# Patient Record
Sex: Male | Born: 1955 | Race: White | Hispanic: No | Marital: Married | Smoking: Former smoker
Health system: Southern US, Community
[De-identification: ages and names within clinical notes are randomized; demographics above are authoritative.]

## PROBLEM LIST (undated history)

## (undated) DIAGNOSIS — R0600 Dyspnea, unspecified: Secondary | ICD-10-CM

## (undated) DIAGNOSIS — J449 Chronic obstructive pulmonary disease, unspecified: Secondary | ICD-10-CM

## (undated) DIAGNOSIS — F329 Major depressive disorder, single episode, unspecified: Secondary | ICD-10-CM

## (undated) DIAGNOSIS — F419 Anxiety disorder, unspecified: Secondary | ICD-10-CM

## (undated) DIAGNOSIS — N189 Chronic kidney disease, unspecified: Secondary | ICD-10-CM

## (undated) DIAGNOSIS — F32A Depression, unspecified: Secondary | ICD-10-CM

## (undated) DIAGNOSIS — I499 Cardiac arrhythmia, unspecified: Secondary | ICD-10-CM

## (undated) DIAGNOSIS — J189 Pneumonia, unspecified organism: Secondary | ICD-10-CM

## (undated) DIAGNOSIS — M1990 Unspecified osteoarthritis, unspecified site: Secondary | ICD-10-CM

## (undated) DIAGNOSIS — K219 Gastro-esophageal reflux disease without esophagitis: Secondary | ICD-10-CM

## (undated) DIAGNOSIS — J45909 Unspecified asthma, uncomplicated: Secondary | ICD-10-CM

## (undated) HISTORY — PX: ABLATION: SHX5711

## (undated) HISTORY — PX: LUNG BIOPSY: SHX232

---

## 2017-06-12 DIAGNOSIS — I4891 Unspecified atrial fibrillation: Secondary | ICD-10-CM | POA: Diagnosis not present

## 2017-06-12 DIAGNOSIS — I10 Essential (primary) hypertension: Secondary | ICD-10-CM | POA: Diagnosis not present

## 2017-06-12 DIAGNOSIS — J9621 Acute and chronic respiratory failure with hypoxia: Secondary | ICD-10-CM | POA: Diagnosis not present

## 2017-06-12 DIAGNOSIS — J449 Chronic obstructive pulmonary disease, unspecified: Secondary | ICD-10-CM

## 2017-06-12 DIAGNOSIS — J189 Pneumonia, unspecified organism: Secondary | ICD-10-CM | POA: Diagnosis not present

## 2017-06-13 DIAGNOSIS — J449 Chronic obstructive pulmonary disease, unspecified: Secondary | ICD-10-CM | POA: Diagnosis not present

## 2017-06-13 DIAGNOSIS — J9621 Acute and chronic respiratory failure with hypoxia: Secondary | ICD-10-CM | POA: Diagnosis not present

## 2017-06-13 DIAGNOSIS — I10 Essential (primary) hypertension: Secondary | ICD-10-CM | POA: Diagnosis not present

## 2017-06-13 DIAGNOSIS — J189 Pneumonia, unspecified organism: Secondary | ICD-10-CM | POA: Diagnosis not present

## 2017-06-13 DIAGNOSIS — I4891 Unspecified atrial fibrillation: Secondary | ICD-10-CM | POA: Diagnosis not present

## 2017-06-14 DIAGNOSIS — J449 Chronic obstructive pulmonary disease, unspecified: Secondary | ICD-10-CM | POA: Diagnosis not present

## 2017-06-14 DIAGNOSIS — J9621 Acute and chronic respiratory failure with hypoxia: Secondary | ICD-10-CM | POA: Diagnosis not present

## 2017-06-14 DIAGNOSIS — J189 Pneumonia, unspecified organism: Secondary | ICD-10-CM | POA: Diagnosis not present

## 2017-06-14 DIAGNOSIS — I4891 Unspecified atrial fibrillation: Secondary | ICD-10-CM | POA: Diagnosis not present

## 2017-06-15 DIAGNOSIS — I4891 Unspecified atrial fibrillation: Secondary | ICD-10-CM | POA: Diagnosis not present

## 2017-06-15 DIAGNOSIS — J449 Chronic obstructive pulmonary disease, unspecified: Secondary | ICD-10-CM | POA: Diagnosis not present

## 2017-06-15 DIAGNOSIS — J9621 Acute and chronic respiratory failure with hypoxia: Secondary | ICD-10-CM | POA: Diagnosis not present

## 2017-06-15 DIAGNOSIS — J189 Pneumonia, unspecified organism: Secondary | ICD-10-CM | POA: Diagnosis not present

## 2017-06-16 DIAGNOSIS — J9621 Acute and chronic respiratory failure with hypoxia: Secondary | ICD-10-CM | POA: Diagnosis not present

## 2017-06-16 DIAGNOSIS — I4891 Unspecified atrial fibrillation: Secondary | ICD-10-CM | POA: Diagnosis not present

## 2017-06-16 DIAGNOSIS — J449 Chronic obstructive pulmonary disease, unspecified: Secondary | ICD-10-CM | POA: Diagnosis not present

## 2017-06-16 DIAGNOSIS — J189 Pneumonia, unspecified organism: Secondary | ICD-10-CM | POA: Diagnosis not present

## 2017-07-16 ENCOUNTER — Other Ambulatory Visit: Payer: Self-pay

## 2017-07-16 ENCOUNTER — Inpatient Hospital Stay (HOSPITAL_COMMUNITY)
Admission: EM | Admit: 2017-07-16 | Discharge: 2017-07-19 | DRG: 190 | Disposition: A | Discharge status: Home Health | Payer: Medicaid Other | Attending: Family Medicine | Admitting: Family Medicine

## 2017-07-16 ENCOUNTER — Emergency Department (HOSPITAL_COMMUNITY): Payer: Medicaid Other

## 2017-07-16 ENCOUNTER — Encounter (HOSPITAL_COMMUNITY): Payer: Self-pay | Admitting: Nurse Practitioner

## 2017-07-16 DIAGNOSIS — K219 Gastro-esophageal reflux disease without esophagitis: Secondary | ICD-10-CM | POA: Diagnosis present

## 2017-07-16 DIAGNOSIS — I129 Hypertensive chronic kidney disease with stage 1 through stage 4 chronic kidney disease, or unspecified chronic kidney disease: Secondary | ICD-10-CM | POA: Diagnosis present

## 2017-07-16 DIAGNOSIS — R531 Weakness: Secondary | ICD-10-CM | POA: Diagnosis not present

## 2017-07-16 DIAGNOSIS — G8929 Other chronic pain: Secondary | ICD-10-CM | POA: Diagnosis present

## 2017-07-16 DIAGNOSIS — Z87891 Personal history of nicotine dependence: Secondary | ICD-10-CM

## 2017-07-16 DIAGNOSIS — J206 Acute bronchitis due to rhinovirus: Secondary | ICD-10-CM | POA: Diagnosis present

## 2017-07-16 DIAGNOSIS — E876 Hypokalemia: Secondary | ICD-10-CM | POA: Diagnosis present

## 2017-07-16 DIAGNOSIS — Z9981 Dependence on supplemental oxygen: Secondary | ICD-10-CM | POA: Diagnosis not present

## 2017-07-16 DIAGNOSIS — J441 Chronic obstructive pulmonary disease with (acute) exacerbation: Secondary | ICD-10-CM

## 2017-07-16 DIAGNOSIS — J9620 Acute and chronic respiratory failure, unspecified whether with hypoxia or hypercapnia: Secondary | ICD-10-CM | POA: Diagnosis present

## 2017-07-16 DIAGNOSIS — D72829 Elevated white blood cell count, unspecified: Secondary | ICD-10-CM | POA: Diagnosis present

## 2017-07-16 DIAGNOSIS — Q600 Renal agenesis, unilateral: Secondary | ICD-10-CM

## 2017-07-16 DIAGNOSIS — I495 Sick sinus syndrome: Secondary | ICD-10-CM | POA: Diagnosis present

## 2017-07-16 DIAGNOSIS — Z881 Allergy status to other antibiotic agents status: Secondary | ICD-10-CM

## 2017-07-16 DIAGNOSIS — N189 Chronic kidney disease, unspecified: Secondary | ICD-10-CM | POA: Diagnosis present

## 2017-07-16 DIAGNOSIS — G4700 Insomnia, unspecified: Secondary | ICD-10-CM | POA: Diagnosis present

## 2017-07-16 DIAGNOSIS — T380X5A Adverse effect of glucocorticoids and synthetic analogues, initial encounter: Secondary | ICD-10-CM | POA: Diagnosis present

## 2017-07-16 DIAGNOSIS — Z888 Allergy status to other drugs, medicaments and biological substances status: Secondary | ICD-10-CM

## 2017-07-16 DIAGNOSIS — Z7951 Long term (current) use of inhaled steroids: Secondary | ICD-10-CM | POA: Diagnosis not present

## 2017-07-16 DIAGNOSIS — R51 Headache: Secondary | ICD-10-CM | POA: Diagnosis present

## 2017-07-16 DIAGNOSIS — Z7901 Long term (current) use of anticoagulants: Secondary | ICD-10-CM

## 2017-07-16 DIAGNOSIS — J431 Panlobular emphysema: Principal | ICD-10-CM | POA: Diagnosis present

## 2017-07-16 DIAGNOSIS — J9621 Acute and chronic respiratory failure with hypoxia: Secondary | ICD-10-CM

## 2017-07-16 DIAGNOSIS — I480 Paroxysmal atrial fibrillation: Secondary | ICD-10-CM | POA: Diagnosis present

## 2017-07-16 DIAGNOSIS — Z91013 Allergy to seafood: Secondary | ICD-10-CM

## 2017-07-16 HISTORY — DX: Dyspnea, unspecified: R06.00

## 2017-07-16 HISTORY — DX: Anxiety disorder, unspecified: F41.9

## 2017-07-16 HISTORY — DX: Cardiac arrhythmia, unspecified: I49.9

## 2017-07-16 HISTORY — DX: Chronic kidney disease, unspecified: N18.9

## 2017-07-16 HISTORY — DX: Unspecified asthma, uncomplicated: J45.909

## 2017-07-16 HISTORY — DX: Chronic obstructive pulmonary disease, unspecified: J44.9

## 2017-07-16 HISTORY — DX: Unspecified osteoarthritis, unspecified site: M19.90

## 2017-07-16 HISTORY — DX: Pneumonia, unspecified organism: J18.9

## 2017-07-16 HISTORY — DX: Depression, unspecified: F32.A

## 2017-07-16 HISTORY — DX: Gastro-esophageal reflux disease without esophagitis: K21.9

## 2017-07-16 HISTORY — DX: Major depressive disorder, single episode, unspecified: F32.9

## 2017-07-16 LAB — CBC
HCT: 37.5 % — ABNORMAL LOW (ref 39.0–52.0)
Hemoglobin: 13.1 g/dL (ref 13.0–17.0)
MCH: 30.1 pg (ref 26.0–34.0)
MCHC: 34.9 g/dL (ref 30.0–36.0)
MCV: 86.2 fL (ref 78.0–100.0)
PLATELETS: 142 10*3/uL — AB (ref 150–400)
RBC: 4.35 MIL/uL (ref 4.22–5.81)
RDW: 15.2 % (ref 11.5–15.5)
WBC: 13.3 10*3/uL — AB (ref 4.0–10.5)

## 2017-07-16 LAB — RESPIRATORY PANEL BY PCR
ADENOVIRUS-RVPPCR: NOT DETECTED
BORDETELLA PERTUSSIS-RVPCR: NOT DETECTED
CHLAMYDOPHILA PNEUMONIAE-RVPPCR: NOT DETECTED
CORONAVIRUS HKU1-RVPPCR: NOT DETECTED
CORONAVIRUS NL63-RVPPCR: NOT DETECTED
Coronavirus 229E: NOT DETECTED
Coronavirus OC43: NOT DETECTED
Influenza A: NOT DETECTED
Influenza B: NOT DETECTED
MYCOPLASMA PNEUMONIAE-RVPPCR: NOT DETECTED
Metapneumovirus: NOT DETECTED
Parainfluenza Virus 1: NOT DETECTED
Parainfluenza Virus 2: NOT DETECTED
Parainfluenza Virus 3: NOT DETECTED
Parainfluenza Virus 4: NOT DETECTED
Respiratory Syncytial Virus: NOT DETECTED
Rhinovirus / Enterovirus: DETECTED — AB

## 2017-07-16 LAB — COMPREHENSIVE METABOLIC PANEL
ALK PHOS: 96 U/L (ref 38–126)
ALT: 33 U/L (ref 17–63)
ANION GAP: 11 (ref 5–15)
AST: 27 U/L (ref 15–41)
Albumin: 3.3 g/dL — ABNORMAL LOW (ref 3.5–5.0)
BILIRUBIN TOTAL: 1.4 mg/dL — AB (ref 0.3–1.2)
BUN: 35 mg/dL — ABNORMAL HIGH (ref 6–20)
CALCIUM: 8.7 mg/dL — AB (ref 8.9–10.3)
CO2: 32 mmol/L (ref 22–32)
Chloride: 91 mmol/L — ABNORMAL LOW (ref 101–111)
Creatinine, Ser: 0.96 mg/dL (ref 0.61–1.24)
GFR calc Af Amer: >60 mL/min (ref >60)
Glucose, Bld: 135 mg/dL — ABNORMAL HIGH (ref 65–99)
Potassium: 2.7 mmol/L — CL (ref 3.5–5.1)
SODIUM: 134 mmol/L — AB (ref 135–145)
TOTAL PROTEIN: 5.6 g/dL — AB (ref 6.5–8.1)

## 2017-07-16 LAB — MAGNESIUM: MAGNESIUM: 2 mg/dL (ref 1.7–2.4)

## 2017-07-16 LAB — I-STAT TROPONIN, ED: TROPONIN I, POC: 0.01 ng/mL (ref 0.00–0.08)

## 2017-07-16 MED ORDER — ACETAMINOPHEN 650 MG RE SUPP: 650.0000 mg | Freq: Four times a day (QID) | RECTAL | Status: DC | PRN

## 2017-07-16 MED ORDER — ACETAMINOPHEN 325 MG PO TABS
650.0000 mg | ORAL_TABLET | Freq: Once | ORAL | Status: AC
  Administered 2017-07-16: 650 mg via ORAL
  Filled 2017-07-16: qty 2

## 2017-07-16 MED ORDER — APIXABAN 5 MG PO TABS
5.0000 mg | ORAL_TABLET | Freq: Two times a day (BID) | ORAL | Status: DC
  Administered 2017-07-16 – 2017-07-19 (×6): 5 mg via ORAL
  Filled 2017-07-16 (×6): qty 1

## 2017-07-16 MED ORDER — SODIUM CHLORIDE 0.9 % IV SOLN
INTRAVENOUS | Status: DC
  Administered 2017-07-16: 16:00:00 via INTRAVENOUS

## 2017-07-16 MED ORDER — ZOLPIDEM TARTRATE 10 MG PO TABS
10.0000 mg | ORAL_TABLET | Freq: Every evening | ORAL | Status: DC | PRN
  Administered 2017-07-16 – 2017-07-18 (×3): 10 mg via ORAL
  Filled 2017-07-16 (×3): qty 1

## 2017-07-16 MED ORDER — ACETAMINOPHEN 325 MG PO TABS: 650.0000 mg | ORAL_TABLET | Freq: Four times a day (QID) | ORAL | Status: DC | PRN

## 2017-07-16 MED ORDER — PREDNISONE 20 MG PO TABS
40.0000 mg | ORAL_TABLET | Freq: Every day | ORAL | Status: DC
  Administered 2017-07-18 – 2017-07-19 (×2): 40 mg via ORAL
  Filled 2017-07-16 (×2): qty 2

## 2017-07-16 MED ORDER — HYDROMORPHONE HCL 1 MG/ML IJ SOLN
0.5000 mg | Freq: Once | INTRAMUSCULAR | Status: AC
  Administered 2017-07-16: 0.5 mg via INTRAVENOUS
  Filled 2017-07-16: qty 1

## 2017-07-16 MED ORDER — ALBUTEROL SULFATE (2.5 MG/3ML) 0.083% IN NEBU
5.0000 mg | INHALATION_SOLUTION | Freq: Once | RESPIRATORY_TRACT | Status: AC
  Administered 2017-07-16: 5 mg via RESPIRATORY_TRACT
  Filled 2017-07-16: qty 6

## 2017-07-16 MED ORDER — METHYLPREDNISOLONE SODIUM SUCC 125 MG IJ SOLR
80.0000 mg | Freq: Three times a day (TID) | INTRAMUSCULAR | Status: AC
  Administered 2017-07-16 – 2017-07-18 (×5): 80 mg via INTRAVENOUS
  Filled 2017-07-16 (×5): qty 2

## 2017-07-16 MED ORDER — HYDROCODONE-ACETAMINOPHEN 5-325 MG PO TABS
1.0000 | ORAL_TABLET | Freq: Three times a day (TID) | ORAL | Status: DC | PRN
  Administered 2017-07-16 – 2017-07-17 (×2): 1 via ORAL
  Filled 2017-07-16 (×2): qty 1

## 2017-07-16 MED ORDER — IPRATROPIUM-ALBUTEROL 0.5-2.5 (3) MG/3ML IN SOLN
3.0000 mL | Freq: Four times a day (QID) | RESPIRATORY_TRACT | Status: DC
  Administered 2017-07-16 – 2017-07-19 (×10): 3 mL via RESPIRATORY_TRACT
  Filled 2017-07-16 (×10): qty 3

## 2017-07-16 MED ORDER — IPRATROPIUM BROMIDE 0.02 % IN SOLN
0.5000 mg | Freq: Once | RESPIRATORY_TRACT | Status: AC
  Administered 2017-07-16: 0.5 mg via RESPIRATORY_TRACT
  Filled 2017-07-16: qty 2.5

## 2017-07-16 MED ORDER — PANTOPRAZOLE SODIUM 40 MG PO TBEC
40.0000 mg | DELAYED_RELEASE_TABLET | Freq: Every day | ORAL | Status: DC
  Administered 2017-07-17 – 2017-07-19 (×3): 40 mg via ORAL
  Filled 2017-07-16 (×3): qty 1

## 2017-07-16 MED ORDER — POTASSIUM CHLORIDE CRYS ER 20 MEQ PO TBCR
40.0000 meq | EXTENDED_RELEASE_TABLET | Freq: Once | ORAL | Status: AC
Start: 2017-07-16 — End: 2017-07-16
  Administered 2017-07-16: 40 meq via ORAL
  Filled 2017-07-16: qty 2

## 2017-07-16 MED ORDER — POTASSIUM CHLORIDE 10 MEQ/100ML IV SOLN
10.0000 meq | Freq: Once | INTRAVENOUS | Status: AC
  Administered 2017-07-16: 10 meq via INTRAVENOUS
  Filled 2017-07-16: qty 100

## 2017-07-16 MED ORDER — METHYLPREDNISOLONE SODIUM SUCC 125 MG IJ SOLR
125.0000 mg | Freq: Once | INTRAMUSCULAR | Status: AC
  Administered 2017-07-16: 125 mg via INTRAVENOUS
  Filled 2017-07-16: qty 2

## 2017-07-16 MED ORDER — FAMOTIDINE 20 MG PO TABS
20.0000 mg | ORAL_TABLET | Freq: Every day | ORAL | Status: DC
  Administered 2017-07-16 – 2017-07-18 (×3): 20 mg via ORAL
  Filled 2017-07-16 (×3): qty 1

## 2017-07-16 MED ORDER — SODIUM CHLORIDE 0.9% FLUSH
3.0000 mL | Freq: Two times a day (BID) | INTRAVENOUS | Status: DC
  Administered 2017-07-16 – 2017-07-19 (×6): 3 mL via INTRAVENOUS

## 2017-07-16 MED ORDER — MOMETASONE FURO-FORMOTEROL FUM 200-5 MCG/ACT IN AERO
2.0000 | INHALATION_SPRAY | Freq: Two times a day (BID) | RESPIRATORY_TRACT | Status: DC
  Administered 2017-07-16 – 2017-07-19 (×6): 2 via RESPIRATORY_TRACT
  Filled 2017-07-16: qty 8.8

## 2017-07-16 MED ORDER — DILTIAZEM HCL ER BEADS 240 MG PO CP24
240.0000 mg | ORAL_CAPSULE | Freq: Every day | ORAL | Status: DC
  Administered 2017-07-17 – 2017-07-19 (×3): 240 mg via ORAL
  Filled 2017-07-16 (×6): qty 1

## 2017-07-16 MED ORDER — CLONIDINE HCL 0.1 MG PO TABS
0.1000 mg | ORAL_TABLET | Freq: Two times a day (BID) | ORAL | Status: DC
  Administered 2017-07-16 – 2017-07-19 (×6): 0.1 mg via ORAL
  Filled 2017-07-16 (×6): qty 1

## 2017-07-16 MED ORDER — TRAMADOL HCL 50 MG PO TABS
50.0000 mg | ORAL_TABLET | Freq: Three times a day (TID) | ORAL | Status: DC
  Administered 2017-07-16 – 2017-07-19 (×8): 50 mg via ORAL
  Filled 2017-07-16 (×8): qty 1

## 2017-07-16 MED ORDER — FLUTICASONE PROPIONATE 50 MCG/ACT NA SUSP
1.0000 | Freq: Two times a day (BID) | NASAL | Status: DC
  Administered 2017-07-16 – 2017-07-19 (×5): 1 via NASAL
  Filled 2017-07-16: qty 16

## 2017-07-16 MED ORDER — ONDANSETRON HCL 4 MG PO TABS: 4.0000 mg | ORAL_TABLET | Freq: Four times a day (QID) | ORAL | Status: DC | PRN

## 2017-07-16 MED ORDER — NITROGLYCERIN 0.4 MG SL SUBL: 0.4000 mg | SUBLINGUAL_TABLET | SUBLINGUAL | Status: DC | PRN

## 2017-07-16 MED ORDER — IOPAMIDOL (ISOVUE-300) INJECTION 61%
INTRAVENOUS | Status: AC
  Administered 2017-07-16: 75 mL via INTRAVENOUS
  Filled 2017-07-16: qty 75

## 2017-07-16 MED ORDER — HYDROCHLOROTHIAZIDE 25 MG PO TABS
25.0000 mg | ORAL_TABLET | Freq: Every day | ORAL | Status: DC
  Administered 2017-07-16 – 2017-07-19 (×4): 25 mg via ORAL
  Filled 2017-07-16 (×4): qty 1

## 2017-07-16 MED ORDER — PROPAFENONE HCL 150 MG PO TABS
150.0000 mg | ORAL_TABLET | Freq: Three times a day (TID) | ORAL | Status: DC
  Administered 2017-07-16 – 2017-07-19 (×9): 150 mg via ORAL
  Filled 2017-07-16 (×9): qty 1

## 2017-07-16 MED ORDER — ALBUTEROL SULFATE (2.5 MG/3ML) 0.083% IN NEBU
2.5000 mg | INHALATION_SOLUTION | RESPIRATORY_TRACT | Status: DC | PRN
  Administered 2017-07-17 – 2017-07-18 (×2): 2.5 mg via RESPIRATORY_TRACT
  Filled 2017-07-16 (×2): qty 3

## 2017-07-16 MED ORDER — ONDANSETRON HCL 4 MG/2ML IJ SOLN
4.0000 mg | Freq: Four times a day (QID) | INTRAMUSCULAR | Status: DC | PRN
  Administered 2017-07-17 – 2017-07-18 (×2): 4 mg via INTRAVENOUS
  Filled 2017-07-16 (×2): qty 2

## 2017-07-16 NOTE — H&P (Addendum)
History and Physical   Stephen Vazquez WUJ:[id number] DOB: 03/04/1956 DOA: 07/16/2017  Referring MD/NP/PA: Denton Lank PCP: Simone Curia, MD Outpatient Specialists: [facility] cardiology, Dr. Rhona Leavens  Patient coming from: Home  Chief Complaint: Weakness, dyspnea  HPI: Stephen Vazquez is a 62 y.o. male with a history of 2L-dependent COPD, PAF on eliquis who presented to the ED with one month of progressive weakness during which time he noted swelling in his face/head. He presented to multiple providers during this time for cough and dyspnea, receiving multiple antibiotics and steroid tapers with temporary improvement in respiratory symptoms, though over the past few days he's had severe, constant dyspnea with any exertion, nonproductive cough. He denies sick contacts, got his flu shot, stopped smoking 14 years ago. He's had COPD for >5 years, compliant with medications, does not have a pulmonologist.   ED Course: 3 rounds of breathing treatments provided with very limited improvement in dyspnea. CTA negative for acute process, but demonstrated severe emphysema and peribronchial thickening. He reports poor per oral intake and potassium was low, repleted in ED.   Review of Systems: +extremity weakness and bruising more easily on arms. Denies fevers, chest pain, palpitations, abd pain, N/V/D, dysuria, change in bowel habits, and per HPI. All others reviewed and are negative.   Past Medical History:  Diagnosis Date  . Anxiety   . Arthritis   . Asthma   . 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 COPD (chronic obstructive pulmonary disease) (HCC)   . Depression   . Dyspnea   . Dysrhythmia   . GERD (gastroesophageal reflux disease)   . Pneumonia    Past Surgical History:  Procedure Laterality Date  . ABLATION    . LUNG BIOPSY     x 2   - No longer smoking, no EtOH, cared for by his brother who is at the bedside.  reports that he quit smoking about 14 years ago. His smoking use included  cigarettes. He quit smokeless tobacco use about 11 years ago. His smokeless tobacco use included chew. He reports that he does not drink alcohol or use drugs. Allergies  Allergen Reactions  . Shellfish Allergy Nausea And Vomiting  . Ketorolac Palpitations  . Levaquin [Levofloxacin] Palpitations   Family History  Problem Relation Age of Onset  . COPD Brother    - Brother also has COPD. Family history otherwise reviewed and not pertinent. Prior to Admission medications   Medication Sig Start Date End Date Taking? Authorizing Provider  albuterol (VENTOLIN HFA) 108 (90 Base) MCG/ACT inhaler Inhale 1 puff into the lungs every 4 (four) hours as needed for wheezing or shortness of breath.    Yes [provider]  apixaban (ELIQUIS) 5 MG TABS tablet Take 5 mg by mouth 2 (two) times daily. 03/13/17  Yes [provider]  cloNIDine (CATAPRES) 0.1 MG tablet Take 0.1 mg by mouth 2 (two) times daily. 04/10/17 04/10/18 Yes [provider]  diltiazem (TIAZAC) 240 MG 24 hr capsule Take 240 mg by mouth daily.   Yes [provider]  fluticasone (FLONASE) 50 MCG/ACT nasal spray Place 1 spray into the nose 2 (two) times daily.   Yes [provider]  fluticasone-salmeterol (ADVAIR HFA) 230-21 MCG/ACT inhaler Inhale 2 puffs into the lungs 2 (two) times daily. 03/20/17  Yes [provider]  furosemide (LASIX) 20 MG tablet Take 20 mg by mouth daily.   Yes [provider]  hydrochlorothiazide (HYDRODIURIL) 25 MG tablet Take 25  mg by mouth daily. 03/26/17  Yes [provider]  HYDROcodone-acetaminophen (NORCO/VICODIN) 5-325 MG tablet Take 1 tablet by mouth every 8 (eight) hours as needed for moderate pain.  05/19/17  Yes [provider]  nitroGLYCERIN (NITROSTAT) 0.4 MG SL tablet Place 0.4 mg under the tongue every 5 (five) minutes as needed for chest pain.    Yes [provider]  omeprazole (PRILOSEC) 20 MG capsule Take 20 mg by mouth  daily.   Yes [provider]  propafenone (RYTHMOL) 150 MG tablet Take 150 mg by mouth every 8 (eight) hours. 04/08/17 04/08/18 Yes [provider]  ranitidine (ZANTAC) 300 MG tablet Take 300 mg by mouth at bedtime.   Yes [provider]  tiotropium (SPIRIVA) 18 MCG inhalation capsule Place 18 mcg into inhaler and inhale daily.    Yes [provider]  traMADol (ULTRAM) 50 MG tablet Take 50 mg by mouth 3 (three) times daily.   Yes [provider]  zolpidem (AMBIEN) 10 MG tablet Take 10 mg by mouth at bedtime as needed for sleep. 05/19/17  Yes [provider]    Physical Exam: Vitals:   07/16/17 1428 07/16/17 1500 07/16/17 1653 07/16/17 1701  BP:  (!) 154/103  (!) 154/100  Pulse:  90  91  Resp:  16  16  Temp:      TempSrc:      SpO2: 98% 99% 97% 100%  Weight:      Height:       Constitutional: Ill but non-toxic 62 y.o. male in no distress, calm demeanor Eyes: Lids and conjunctivae normal, PERRL ENMT: Mucous membranes are moist. Posterior pharynx clear of any exudate or lesions. Poor dentition.  Neck: normal, supple, no masses, no thyromegaly Respiratory: Nonlabored tachypnea with prolonged expiration on 2L by [place], fair air movement with diffuse expiratory wheezing/rhonchi Cardiovascular: Regular rate and rhythm, no murmurs, rubs, or gallops. No carotid bruits. No JVD. No LE edema. 2+ pedal pulses. Abdomen: Normoactive bowel sounds. No tenderness, non-distended, and no masses palpated. No hepatosplenomegaly. GU: No indwelling catheter Musculoskeletal: No cyanosis. No joint deformity upper and lower extremities. Cushingoid habitus. Skin: Warm, dry. Ecchymoses on bilateral forearms. Generalized facial fullness noted.  Neurologic: CN II-XII grossly intact. Gait not assessed. Speech normal. Diffusely weak, needing help to rise for lung exam, no focal deficits in motor strength or sensation in all extremities.  Psychiatric: Alert and oriented  x3. Intact judgment and insight. Mood euthymic with congruent affect.   Labs on Admission: I have personally reviewed following labs and imaging studies  CBC: Recent Labs  Lab 07/16/17 1404  WBC 13.3*  HGB 13.1  HCT 37.5*  MCV 86.2  PLT 142*   Basic Metabolic Panel: Recent Labs  Lab 07/16/17 1404  NA 134*  K 2.7*  CL 91*  CO2 32  GLUCOSE 135*  BUN 35*  CREATININE 0.96  CALCIUM 8.7*  MG 2.0   Liver Function Tests: Recent Labs  Lab 07/16/17 1404  AST 27  ALT 33  ALKPHOS 96  BILITOT 1.4*  PROT 5.6*  ALBUMIN 3.3*   Radiological Exams on Admission: Dg Chest 2 View  Result Date: 07/16/2017 CLINICAL DATA:  Worsening cough, congestion and dyspnea EXAM: CHEST  2 VIEW COMPARISON:  07/13/2017 FINDINGS: The heart size and mediastinal contours are within normal limits. No pneumonic consolidations or CHF. Chronic subpleural areas of interstitial prominence likely reflecting atelectasis and/or fibrosis are identified along the periphery of the right lung and left lung base. The  visualized skeletal structures are unremarkable. IMPRESSION: No active cardiopulmonary disease. Electronically Signed   By: Tollie Eth M.D.   On: 07/16/2017 13:31   Ct Head Wo Contrast  Result Date: 07/16/2017 CLINICAL DATA:  Headache over the last month.  Worsening weakness. EXAM: CT HEAD WITHOUT CONTRAST TECHNIQUE: Contiguous axial images were obtained from the base of the skull through the vertex without intravenous contrast. COMPARISON:  06/26/2016 FINDINGS: Brain: No evidence of malformation, atrophy, old or acute small or large vessel infarction, mass lesion, hemorrhage, hydrocephalus or extra-axial collection. No evidence of pituitary lesion. Vascular: No vascular calcification.  No hyperdense vessels. Skull: Normal.  No fracture or focal bone lesion. Sinuses/Orbits: Visualized sinuses are clear. No fluid in the middle ears or mastoids. Visualized orbits are normal. Other: None significant IMPRESSION:  Normal examination. No abnormality seen to explain the presenting symptoms. Electronically Signed   By: Paulina Fusi M.D.   On: 07/16/2017 16:15   Ct Chest W Contrast  Result Date: 07/16/2017 CLINICAL DATA:  Worsening shortness of breath with chest congestion and productive cough. History COPD. EXAM: CT CHEST WITH CONTRAST TECHNIQUE: Multidetector CT imaging of the chest was performed during intravenous contrast administration. CONTRAST:  75mL ISOVUE-300 IOPAMIDOL (ISOVUE-300) INJECTION 61% COMPARISON:  CT chest dated June 13, 2017. FINDINGS: Cardiovascular: Normal heart size. No pericardial effusion. Normal caliber thoracic aorta with scattered with atherosclerosis. Unchanged chronic dilatation of the main pulmonary artery. No central pulmonary embolism. Mediastinum/Nodes: Multiple previously seen prominent mediastinal lymph nodes have decreased in size, now subcentimeter. No mediastinal, hilar, or axillary lymphadenopathy. The thyroid gland, trachea, and esophagus demonstrate no significant abnormalities. Lungs/Pleura: Severe emphysematous changes again noted. Mild central peribronchial thickening. Previously seen ground-glass opacities in both lower lobes the right upper lobe have resolved. Unchanged subpleural right middle lobe nodule measuring 5 mm (series 6, image 99). Unchanged subpleural left lower lobe nodule measuring 6 mm (series 6, image 117). No pleural effusion or pneumothorax. Upper Abdomen: No acute abnormality. Unchanged severely atrophic left kidney. Musculoskeletal: No chest wall abnormality. No acute or significant osseous findings. IMPRESSION: 1. No acute intrathoracic process. Resolved ground-glass opacities in both lower lobes and the right upper lobe. 2. Severe emphysema (ICD10-J43.9). Persistent central peribronchial thickening which may be smoking-related or secondary to acute bronchitis. 3. Interval decrease in size of previously seen prominent mediastinal lymph nodes, now  subcentimeter. Electronically Signed   By: Obie Dredge M.D.   On: 07/16/2017 16:25   Assessment/Plan Principal Problem:   COPD with acute exacerbation (HCC) Active Problems:   Acute on chronic respiratory failure (HCC)   Panlobular emphysema (HCC)   GERD (gastroesophageal reflux disease)   AF (paroxysmal atrial fibrillation) (HCC)   Hypokalemia   Weakness   Insomnia   Acute on chronic hypoxic respiratory failure: Due to acute bronchitis/COPD exacerbation. Severe panlobular emphysema noted on CT. Stopped smoking 14 years ago.  - IV steroids followed by prednisone as ordered, modify as needed. - Continue advair (dulera sub) - Scheduled duoneb, prn albuterol - Check RVP, empiric droplet precautions.  - CM consulted - Really needs pulmonology as outpatient.   Weakness:  - PT/OT consulted  Facial fullness/headache: Without airway compromise or focal swelling, suspect this could be related to cushingoid habitus. CT head normal. - Monitor   Hypokalemia: Replaced in ED.  - Recheck in AM, checking Mg now  HTN: Chronic, currently elevated.  - Restart home medications: clonidine, HCTZ  PAF and SSS: Followed by Dr. Rhona Leavens, [facility] Cardiology. Currently appears to be NSR on  monitor.  - Check ECG, troponin wnl.  - Continue eliquis, diltiazem, propafenone. No longer on BB.   GERD: Chronic, stable - Home medications  Insomnia:  - Home med ordered  Chronic pain:  - Continue home medications.   DVT prophylaxis: Eliquis  Code Status: Full, confirmed with patient and family at admission  Family Communication: Brother, SIL at bedside Disposition Plan: Uncertain Consults called: None  Admission status: Inpatient    Hazeline Junker, MD [facility] Pager [phone]  If 7PM-7AM, please contact night-coverage [email or web address] Password [facility] 07/16/2017, 5:59 PM

## 2017-07-16 NOTE — ED Provider Notes (Signed)
[facility]-EMERGENCY DEPT Provider Note   CSN: [id number] Arrival date & time: 07/16/17  1238     History   Chief Complaint No chief complaint on file.   HPI Stephen Vazquez is a 62 y.o. male.  Patient c/o progressive sob and weakness in the past month. Symptoms moderate, severe, persistent. +unspecific wt loss. Hx copd, smoker. Denies hx lung cancer. Also states feels swollen about head/face since being placed on steroid med last month.  States went to [facility] ED with same last week - denies specific dx.  No chest pain. No leg edema.    The history is provided by the patient.    Past Medical History:  Diagnosis Date  . COPD (chronic obstructive pulmonary disease) (HCC)     There are no active problems to display for this patient.   History reviewed. No pertinent surgical history.     Home Medications    Prior to Admission medications   Not on File    Family History History reviewed. No pertinent family history.  Social History Social History   Tobacco Use  . Smoking status: Former Smoker  Substance Use Topics  . Alcohol use: Not on file  . Drug use: No     Allergies   Patient has no allergy information on record.   Review of Systems Review of Systems  Constitutional: Negative for fever.  HENT: Negative for sore throat.   Eyes: Negative for redness.  Respiratory: Positive for cough, shortness of breath and wheezing.   Cardiovascular: Negative for chest pain.  Gastrointestinal: Negative for abdominal pain and vomiting.  Endocrine: Negative for polyuria.  Genitourinary: Negative for flank pain.  Musculoskeletal: Negative for back pain.  Skin: Negative for rash.  Neurological: Positive for weakness and headaches.  Hematological: Does not bruise/bleed easily.  Psychiatric/Behavioral: Negative for confusion.     Physical Exam Updated Vital Signs BP (!) 150/96   Pulse 80   Temp 98.9 F (37.2 C) (Oral)   Resp 18    Ht 1.676 m (5\' 6" )   Wt 64.9 kg (143 lb)   SpO2 95%   BMI 23.08 kg/m   Physical Exam  Constitutional: He appears well-developed and well-nourished. No distress.  HENT:  Mouth/Throat: Oropharynx is clear and moist.  Eyes: Conjunctivae are normal.  Neck: Neck supple. No tracheal deviation present.  Cardiovascular: Normal rate, regular rhythm, normal heart sounds and intact distal pulses. Exam reveals no gallop and no friction rub.  No murmur heard. Pulmonary/Chest: No accessory muscle usage. He is in respiratory distress. He has wheezes.  Abdominal: Soft. Bowel sounds are normal. He exhibits no distension. There is no tenderness.  Genitourinary:  Genitourinary Comments: No cva tenderness  Musculoskeletal: He exhibits no edema.  Neurological: He is alert.  Skin: Skin is warm and dry. No rash noted. He is not diaphoretic.  Psychiatric: He has a normal mood and affect.  Nursing note and vitals reviewed.    ED Treatments / Results  Labs (all labs ordered are listed, but only abnormal results are displayed) Results for orders placed or performed during the hospital encounter of 07/16/17  CBC  Result Value Ref Range   WBC 13.3 (H) 4.0 - 10.5 K/uL   RBC 4.35 4.22 - 5.81 MIL/uL   Hemoglobin 13.1 13.0 - 17.0 g/dL   HCT 52.837.5 (L) 41.339.0 - 24.452.0 %   MCV 86.2 78.0 - 100.0 fL   MCH 30.1 26.0 - 34.0 pg   MCHC 34.9 30.0 - 36.0  g/dL   RDW 16.1 09.6 - 04.5 %   Platelets 142 (L) 150 - 400 K/uL  Comprehensive metabolic panel  Result Value Ref Range   Sodium 134 (L) 135 - 145 mmol/L   Potassium 2.7 (LL) 3.5 - 5.1 mmol/L   Chloride 91 (L) 101 - 111 mmol/L   CO2 32 22 - 32 mmol/L   Glucose, Bld 135 (H) 65 - 99 mg/dL   BUN 35 (H) 6 - 20 mg/dL   Creatinine, Ser 4.09 0.61 - 1.24 mg/dL   Calcium 8.7 (L) 8.9 - 10.3 mg/dL   Total Protein 5.6 (L) 6.5 - 8.1 g/dL   Albumin 3.3 (L) 3.5 - 5.0 g/dL   AST 27 15 - 41 U/L   ALT 33 17 - 63 U/L   Alkaline Phosphatase 96 38 - 126 U/L   Total Bilirubin 1.4  (H) 0.3 - 1.2 mg/dL   GFR calc non Af Amer >60 >60 mL/min   GFR calc Af Amer >60 >60 mL/min   Anion gap 11 5 - 15  I-stat troponin, ED  Result Value Ref Range   Troponin i, poc 0.01 0.00 - 0.08 ng/mL   Comment 3           Dg Chest 2 View  Result Date: 07/16/2017 CLINICAL DATA:  Worsening cough, congestion and dyspnea EXAM: CHEST  2 VIEW COMPARISON:  07/13/2017 FINDINGS: The heart size and mediastinal contours are within normal limits. No pneumonic consolidations or CHF. Chronic subpleural areas of interstitial prominence likely reflecting atelectasis and/or fibrosis are identified along the periphery of the right lung and left lung base. The visualized skeletal structures are unremarkable. IMPRESSION: No active cardiopulmonary disease. Electronically Signed   By: Tollie Eth M.D.   On: 07/16/2017 13:31   Ct Head Wo Contrast  Result Date: 07/16/2017 CLINICAL DATA:  Headache over the last month.  Worsening weakness. EXAM: CT HEAD WITHOUT CONTRAST TECHNIQUE: Contiguous axial images were obtained from the base of the skull through the vertex without intravenous contrast. COMPARISON:  06/26/2016 FINDINGS: Brain: No evidence of malformation, atrophy, old or acute small or large vessel infarction, mass lesion, hemorrhage, hydrocephalus or extra-axial collection. No evidence of pituitary lesion. Vascular: No vascular calcification.  No hyperdense vessels. Skull: Normal.  No fracture or focal bone lesion. Sinuses/Orbits: Visualized sinuses are clear. No fluid in the middle ears or mastoids. Visualized orbits are normal. Other: None significant IMPRESSION: Normal examination. No abnormality seen to explain the presenting symptoms. Electronically Signed   By: Paulina Fusi M.D.   On: 07/16/2017 16:15   Ct Chest W Contrast  Result Date: 07/16/2017 CLINICAL DATA:  Worsening shortness of breath with chest congestion and productive cough. History COPD. EXAM: CT CHEST WITH CONTRAST TECHNIQUE: Multidetector CT imaging  of the chest was performed during intravenous contrast administration. CONTRAST:  75mL ISOVUE-300 IOPAMIDOL (ISOVUE-300) INJECTION 61% COMPARISON:  CT chest dated June 13, 2017. FINDINGS: Cardiovascular: Normal heart size. No pericardial effusion. Normal caliber thoracic aorta with scattered with atherosclerosis. Unchanged chronic dilatation of the main pulmonary artery. No central pulmonary embolism. Mediastinum/Nodes: Multiple previously seen prominent mediastinal lymph nodes have decreased in size, now subcentimeter. No mediastinal, hilar, or axillary lymphadenopathy. The thyroid gland, trachea, and esophagus demonstrate no significant abnormalities. Lungs/Pleura: Severe emphysematous changes again noted. Mild central peribronchial thickening. Previously seen ground-glass opacities in both lower lobes the right upper lobe have resolved. Unchanged subpleural right middle lobe nodule measuring 5 mm (series 6, image 99). Unchanged subpleural left lower lobe nodule  measuring 6 mm (series 6, image 117). No pleural effusion or pneumothorax. Upper Abdomen: No acute abnormality. Unchanged severely atrophic left kidney. Musculoskeletal: No chest wall abnormality. No acute or significant osseous findings. IMPRESSION: 1. No acute intrathoracic process. Resolved ground-glass opacities in both lower lobes and the right upper lobe. 2. Severe emphysema (ICD10-J43.9). Persistent central peribronchial thickening which may be smoking-related or secondary to acute bronchitis. 3. Interval decrease in size of previously seen prominent mediastinal lymph nodes, now subcentimeter. Electronically Signed   By: Obie Dredge M.D.   On: 07/16/2017 16:25    EKG  EKG Interpretation None       Radiology Dg Chest 2 View  Result Date: 07/16/2017 CLINICAL DATA:  Worsening cough, congestion and dyspnea EXAM: CHEST  2 VIEW COMPARISON:  07/13/2017 FINDINGS: The heart size and mediastinal contours are within normal limits. No  pneumonic consolidations or CHF. Chronic subpleural areas of interstitial prominence likely reflecting atelectasis and/or fibrosis are identified along the periphery of the right lung and left lung base. The visualized skeletal structures are unremarkable. IMPRESSION: No active cardiopulmonary disease. Electronically Signed   By: Tollie Eth M.D.   On: 07/16/2017 13:31   Ct Head Wo Contrast  Result Date: 07/16/2017 CLINICAL DATA:  Headache over the last month.  Worsening weakness. EXAM: CT HEAD WITHOUT CONTRAST TECHNIQUE: Contiguous axial images were obtained from the base of the skull through the vertex without intravenous contrast. COMPARISON:  06/26/2016 FINDINGS: Brain: No evidence of malformation, atrophy, old or acute small or large vessel infarction, mass lesion, hemorrhage, hydrocephalus or extra-axial collection. No evidence of pituitary lesion. Vascular: No vascular calcification.  No hyperdense vessels. Skull: Normal.  No fracture or focal bone lesion. Sinuses/Orbits: Visualized sinuses are clear. No fluid in the middle ears or mastoids. Visualized orbits are normal. Other: None significant IMPRESSION: Normal examination. No abnormality seen to explain the presenting symptoms. Electronically Signed   By: Paulina Fusi M.D.   On: 07/16/2017 16:15   Ct Chest W Contrast  Result Date: 07/16/2017 CLINICAL DATA:  Worsening shortness of breath with chest congestion and productive cough. History COPD. EXAM: CT CHEST WITH CONTRAST TECHNIQUE: Multidetector CT imaging of the chest was performed during intravenous contrast administration. CONTRAST:  75mL ISOVUE-300 IOPAMIDOL (ISOVUE-300) INJECTION 61% COMPARISON:  CT chest dated June 13, 2017. FINDINGS: Cardiovascular: Normal heart size. No pericardial effusion. Normal caliber thoracic aorta with scattered with atherosclerosis. Unchanged chronic dilatation of the main pulmonary artery. No central pulmonary embolism. Mediastinum/Nodes: Multiple previously  seen prominent mediastinal lymph nodes have decreased in size, now subcentimeter. No mediastinal, hilar, or axillary lymphadenopathy. The thyroid gland, trachea, and esophagus demonstrate no significant abnormalities. Lungs/Pleura: Severe emphysematous changes again noted. Mild central peribronchial thickening. Previously seen ground-glass opacities in both lower lobes the right upper lobe have resolved. Unchanged subpleural right middle lobe nodule measuring 5 mm (series 6, image 99). Unchanged subpleural left lower lobe nodule measuring 6 mm (series 6, image 117). No pleural effusion or pneumothorax. Upper Abdomen: No acute abnormality. Unchanged severely atrophic left kidney. Musculoskeletal: No chest wall abnormality. No acute or significant osseous findings. IMPRESSION: 1. No acute intrathoracic process. Resolved ground-glass opacities in both lower lobes and the right upper lobe. 2. Severe emphysema (ICD10-J43.9). Persistent central peribronchial thickening which may be smoking-related or secondary to acute bronchitis. 3. Interval decrease in size of previously seen prominent mediastinal lymph nodes, now subcentimeter. Electronically Signed   By: Obie Dredge M.D.   On: 07/16/2017 16:25    Procedures Procedures (including  critical care time)  Medications Ordered in ED Medications  0.9 %  sodium chloride infusion (not administered)  albuterol (PROVENTIL) (2.5 MG/3ML) 0.083% nebulizer solution 5 mg (not administered)  ipratropium (ATROVENT) nebulizer solution 0.5 mg (not administered)     Initial Impression / Assessment and Plan / ED Course  I have reviewed the triage vital signs and the nursing notes.  Pertinent labs & imaging results that were available during my care of the patient were reviewed by me and considered in my medical decision making (see chart for details).  Iv ns. Albuterol and atrovent neb.   Cxr.  Reviewed nursing notes and prior charts for additional history.    Additional albuterol/atrovent nebs. Solumedrol.  Persistent wheezing.   Improved air exchange.   cts negative for acute process.  Given persistent wheezing/dyspnea - will admit. hospitalists consulted.  k also quite low.  kcl iv and po.  Mg added to labs.    Final Clinical Impressions(s) / ED Diagnoses   Final diagnoses:  None    ED Discharge Orders    None       Cathren Laine, MD 07/16/17 1630

## 2017-07-16 NOTE — ED Notes (Signed)
Critical lab value: Potassium 2.7 Time received: 1457 Notified Dr. Denton LankSteinl

## 2017-07-16 NOTE — ED Notes (Signed)
Patient transported to CT 

## 2017-07-16 NOTE — ED Triage Notes (Signed)
Patient brought in by EMS from home with SOB. Patient has had chest congestion, productive cough. Sinus pressure head pressure, weakness for about a month. Patient has had antibiotics and prednisone and patient states it is getting worse. Patient completed his treatment 2 weeks ago. Patient had a chest xray this past weekend and it showed no pneumonia. Patient states he has a headache and his weakness is getting worse. No apatite. Patietn has a history of COPD wears home O2

## 2017-07-17 ENCOUNTER — Other Ambulatory Visit: Payer: Self-pay

## 2017-07-17 DIAGNOSIS — J441 Chronic obstructive pulmonary disease with (acute) exacerbation: Secondary | ICD-10-CM

## 2017-07-17 LAB — BASIC METABOLIC PANEL
ANION GAP: 12 (ref 5–15)
BUN: 31 mg/dL — ABNORMAL HIGH (ref 6–20)
CALCIUM: 8.9 mg/dL (ref 8.9–10.3)
CHLORIDE: 94 mmol/L — AB (ref 101–111)
CO2: 27 mmol/L (ref 22–32)
Creatinine, Ser: 0.99 mg/dL (ref 0.61–1.24)
GFR calc non Af Amer: >60 mL/min (ref >60)
Glucose, Bld: 204 mg/dL — ABNORMAL HIGH (ref 65–99)
Potassium: 3.2 mmol/L — ABNORMAL LOW (ref 3.5–5.1)
Sodium: 133 mmol/L — ABNORMAL LOW (ref 135–145)

## 2017-07-17 LAB — HIV ANTIBODY (ROUTINE TESTING W REFLEX): HIV Screen 4th Generation wRfx: NONREACTIVE

## 2017-07-17 LAB — MAGNESIUM: Magnesium: 2 mg/dL (ref 1.7–2.4)

## 2017-07-17 MED ORDER — OXYCODONE HCL 5 MG PO TABS
5.0000 mg | ORAL_TABLET | Freq: Four times a day (QID) | ORAL | Status: DC | PRN
  Administered 2017-07-17 – 2017-07-19 (×3): 5 mg via ORAL
  Filled 2017-07-17 (×3): qty 1

## 2017-07-17 MED ORDER — POTASSIUM CHLORIDE CRYS ER 20 MEQ PO TBCR
40.0000 meq | EXTENDED_RELEASE_TABLET | ORAL | Status: AC
  Administered 2017-07-17 (×2): 40 meq via ORAL
  Filled 2017-07-17 (×2): qty 2

## 2017-07-17 MED ORDER — ACETAMINOPHEN 500 MG PO TABS
1000.0000 mg | ORAL_TABLET | Freq: Three times a day (TID) | ORAL | Status: DC
  Administered 2017-07-17 – 2017-07-19 (×7): 1000 mg via ORAL
  Filled 2017-07-17 (×7): qty 2

## 2017-07-17 NOTE — Progress Notes (Signed)
Nutrition Brief Note  RD consulted via COPD gold protocol.  Wt Readings from Last 15 Encounters:  07/16/17 130 lb 4.7 oz (59.1 kg)    Body mass index is 21.03 kg/m. Patient meets criteria for normal based on current BMI.   Current diet order is heart healthy , patient is consuming approximately 75% of meals at this time. Labs and medications reviewed.   No nutrition interventions warranted at this time. If nutrition issues arise, please consult RD.   Tilda FrancoLindsey Cree Kunert, MS, RD, LDN [facility] Clinical Dietitian Pager: [phone] After Hours Pager: [phone]

## 2017-07-17 NOTE — Progress Notes (Signed)
Inpatient Diabetes Program Recommendations  AACE/ADA: New Consensus Statement on Inpatient Glycemic Control (2015)  Target Ranges:  Prepandial:   less than 140 mg/dL      Peak postprandial:   less than 180 mg/dL (1-2 hours)      Critically ill patients:  140 - 180 mg/dL   Results for [name], [name] (MRN [id number]) as of 07/17/2017 08:57  Ref. Range 07/16/2017 14:04 07/17/2017 05:01  Glucose Latest Ref Range: 65 - 99 mg/dL 409135 (H) 811204 (H)     Admit with: acute bronchitis/COPD exacerbation   No History of DM noted.     MD- Note patient getting Solumedrol 80 mg Q8 hours.  Will transition to Prednisone tomorrow (01/04).  Lab glucose elevated to 204 mg/dl this AM.  Please consider placing orders for Novolog Sensitive Correction Scale/ SSI (0-9 units) TID AC + HS while patient getting steroids     --Will follow patient during hospitalization--  Ambrose FinlandJeannine Johnston Geoffery Aultman RN, MSN, CDE Diabetes Coordinator Inpatient Glycemic Control Team Team Pager: [phone] (8a-5p)

## 2017-07-17 NOTE — Evaluation (Signed)
Occupational Therapy Evaluation Patient Details Name: Stephen CarrowBilly Farrell Sperbeck MRN: [id number] DOB: 07/15/1955 Today's Date: 07/17/2017    History of Present Illness 62 yo male admitted with COPD exac. Hx of COPD, PAF, CKD.    Clinical Impression   Pt admitted with COPD exacerbation. Pt currently with functional limitations due to the deficits listed below (see OT Problem List).  Pt will benefit from skilled OT to increase their safety and independence with ADL and functional mobility for ADL to facilitate discharge to venue listed below.      Follow Up Recommendations  SNF    Equipment Recommendations  None recommended by OT    Recommendations for Other Services       Precautions / Restrictions Precautions Precautions: Fall Precaution Comments: O2 dep. Monitor O2 sats.  Restrictions Weight Bearing Restrictions: No      Mobility Bed Mobility Overal bed mobility: Needs Assistance Bed Mobility: Supine to Sit;Sit to Supine     Supine to sit: HOB elevated;Min assist Sit to supine: Min assist   General bed mobility comments: for safety, lines. increased time.   Transfers Overall transfer level: Needs assistance Equipment used: Rolling walker (2 wheeled) Transfers: Sit to/from Stand Sit to Stand: Min assist         General transfer comment: pt plopped with stand to sit    Balance Overall balance assessment: Needs assistance           Standing balance-Leahy Scale: Poor                             ADL either performed or assessed with clinical judgement   ADL Overall ADL's : Needs assistance/impaired Eating/Feeding: Set up;Sitting   Grooming: Minimal assistance;Sitting   Upper Body Bathing: Minimal assistance;Sitting   Lower Body Bathing: Maximal assistance;Sit to/from stand;Cueing for sequencing;Cueing for safety   Upper Body Dressing : Minimal assistance;Sitting   Lower Body Dressing: Maximal assistance;Sit to/from stand;Cueing for  sequencing;Cueing for safety   Toilet Transfer: Minimal assistance;Stand-pivot;Cueing for safety;Cueing for sequencing   Toileting- Clothing Manipulation and Hygiene: Moderate assistance;Sit to/from stand               Vision Patient Visual Report: No change from baseline       Perception     Praxis      Pertinent Vitals/Pain Pain Assessment: Faces Faces Pain Scale: Hurts a little bit Pain Location: headache Pain Intervention(s): Monitored during session;Repositioned     Hand Dominance     Extremity/Trunk Assessment Upper Extremity Assessment Upper Extremity Assessment: Generalized weakness   Lower Extremity Assessment Lower Extremity Assessment: Generalized weakness   Cervical / Trunk Assessment Cervical / Trunk Assessment: Kyphotic   Communication Communication Communication: No difficulties   Cognition Arousal/Alertness: Awake/alert Behavior During Therapy: WFL for tasks assessed/performed Overall Cognitive Status: Within Functional Limits for tasks assessed                                     General Comments       Exercises     Shoulder Instructions      Home Living Family/patient expects to be discharged to:: Private residence Living Arrangements: Spouse/significant other   Type of Home: House Home Access: Stairs to enter Secretary/administratorntrance Stairs-Number of Steps: 3   Home Layout: One level     Bathroom Shower/Tub: Chief Strategy OfficerTub/shower unit   Bathroom Toilet: Standard  Home Equipment: Cane - single point          Prior Functioning/Environment Level of Independence: Needs assistance  Gait / Transfers Assistance Needed: using cane ADL's / Homemaking Assistance Needed: wife helped with ADL and household tasks            OT Problem List: Cardiopulmonary status limiting activity;Decreased knowledge of use of DME or AE;Decreased safety awareness;Impaired balance (sitting and/or standing);Decreased activity tolerance;Decreased  strength      OT Treatment/Interventions: Self-care/ADL training;Patient/family education;DME and/or AE instruction;Energy conservation    OT Goals(Current goals can be found in the care plan section) Acute Rehab OT Goals Patient Stated Goal: to get better and get home OT Goal Formulation: With patient Time For Goal Achievement: 07/31/17  OT Frequency: Min 2X/week   Barriers to D/C: Decreased caregiver support          Co-evaluation              AM-PAC PT "6 Clicks" Daily Activity     Outcome Measure Help from another person eating meals?: None Help from another person taking care of personal grooming?: A Little Help from another person toileting, which includes using toliet, bedpan, or urinal?: A Lot Help from another person bathing (including washing, rinsing, drying)?: A Little Help from another person to put on and taking off regular upper body clothing?: A Lot Help from another person to put on and taking off regular lower body clothing?: A Lot 6 Click Score: 16   End of Session Equipment Utilized During Treatment: Rolling walker Nurse Communication: Mobility status  Activity Tolerance: Patient limited by fatigue Patient left: in bed;with call bell/phone within reach;with family/visitor present;with bed alarm set  OT Visit Diagnosis: Unsteadiness on feet (R26.81);Repeated falls (R29.6);Muscle weakness (generalized) (M62.81);History of falling (Z91.81)                Time: 9147-8295 OT Time Calculation (min): 18 min Charges:  OT General Charges $OT Visit: 1 Visit OT Evaluation $OT Eval Moderate Complexity: 1 Mod G-Codes:     Lise Auer, OT [phone]  Einar Crow D 07/17/2017, 1:04 PM

## 2017-07-17 NOTE — Progress Notes (Signed)
CSW received consult for COPD Gold Protocol, though patient does not meet qualifications (only 1 admission within the past 6 months).  CSW signing off.   Stepanie Graver, LCSWA Clinical Social Worker [facility] Cell#: [phone]  

## 2017-07-17 NOTE — Progress Notes (Signed)
PROGRESS NOTE    Stephen Vazquez  ZOX:[id number] DOB: [birth year] DOA: 07/16/2017 PCP: Stephen Curia, MD    Brief Narrative:  Stephen Vazquez is Stephen Vazquez 62 y.o. male with Stephen Vazquez history of 2L-dependent COPD, PAF on eliquis who presented to the ED with one month of progressive weakness during which time he noted swelling in his face/head. He presented to multiple providers during this time for cough and dyspnea, receiving multiple antibiotics and steroid tapers with temporary improvement in respiratory symptoms, though over the past few days he's had severe, constant dyspnea with any exertion, nonproductive cough. He denies sick contacts, got his flu shot, stopped smoking 14 years ago. He's had COPD for >5 years, compliant with medications, does not have Stephen Vazquez pulmonologist.   Assessment & Plan:   Principal Problem:   COPD with acute exacerbation (HCC) Active Problems:   Acute on chronic respiratory failure (HCC)   Panlobular emphysema (HCC)   GERD (gastroesophageal reflux disease)   AF (paroxysmal atrial fibrillation) (HCC)   Hypokalemia   Weakness   Insomnia   Acute on chronic hypoxic respiratory failure: Due to acute bronchitis/COPD exacerbation. Severe panlobular emphysema noted on CT. Stopped smoking 14 years ago.  - IV steroids followed by prednisone as ordered, modify as needed. - Continue advair (dulera sub) - Scheduled duoneb, prn albuterol - Check RVP, empiric droplet precautions.  - CM consulted - Really needs pulmonology as outpatient.    Weakness: no focal deficits - PT/OT consulted  Facial fullness/headache: Without airway compromise or focal swelling, suspect this could be related to cushingoid habitus vs congestion with +rhinovirus. CT head normal. - Monitor   Hypokalemia:  - follow up mag, replete prn  HTN: - Restart home medications: clonidine, HCTZ  PAF and SSS: Followed by Dr. Rhona Vazquez, [facility].  - Check ECG, troponin wnl.  - Continue eliquis,  diltiazem, propafenone. No longer on BB.   GERD: Chronic, stable - Home medications  Insomnia:  - Home med ordered  Chronic pain:  - Continue home medications.   DVT prophylaxis: apixiban Code Status: full  Family Communication: none at bedside Disposition Plan: pending improvement  Consultants:   none  Procedures: (Don't include imaging studies which can be auto populated. Include things that cannot be auto populated i.e. Echo, Carotid and venous dopplers, Foley, Bipap, HD, tubes/drains, wound vac, central lines etc)  none  Antimicrobials: (specify start and planned stop date. Auto populated tables are space occupying and do not give end dates) Anti-infectives (From admission, onward)   None     Subjective: Still with some SOB. Pressure in head.  Kind of feels like congestion.   Objective: Vitals:   07/16/17 2120 07/16/17 2206 07/17/17 0526 07/17/17 0800  BP: 132/87  (!) 146/96   Pulse: 94  94   Resp: 20  19   Temp: 98.8 F (37.1 C)  97.7 F (36.5 C)   TempSrc: Oral  Oral   SpO2: 96% 92% 94% 97%  Weight:      Height:        Intake/Output Summary (Last 24 hours) at 07/17/2017 1053 Last data filed at 07/17/2017 1020 Gross per 24 hour  Intake 170 ml  Output 175 ml  Net -5 ml   Filed Weights   07/16/17 1254 07/16/17 1953  Weight: 64.9 kg (143 lb) 59.1 kg (130 lb 4.7 oz)    Examination:  General exam: Appears calm and comfortable  Respiratory system: [place] in place, 3 L.  Diffuse wheezing.  Cardiovascular system: S1 &  S2 heard, RRR. No JVD, murmurs, rubs, gallops or clicks. No pedal edema. Gastrointestinal system: Abdomen is nondistended, soft and nontender. No organomegaly or masses felt. Normal bowel sounds heard. Central nervous system: Alert and oriented. CN 2-12 intact.  Symmetric strength. Extremities: no LEE Skin: No rashes, lesions or ulcers Psychiatry: Judgement and insight appear normal. Mood & affect appropriate.     Data Reviewed: I have  personally reviewed following labs and imaging studies  CBC: Recent Labs  Lab 07/16/17 1404  WBC 13.3*  HGB 13.1  HCT 37.5*  MCV 86.2  PLT 142*   Basic Metabolic Panel: Recent Labs  Lab 07/16/17 1404 07/17/17 0501  NA 134* 133*  K 2.7* 3.2*  CL 91* 94*  CO2 32 27  GLUCOSE 135* 204*  BUN 35* 31*  CREATININE 0.96 0.99  CALCIUM 8.7* 8.9  MG 2.0  --    GFR: Estimated Creatinine Clearance: 66.3 mL/min (by C-G formula based on SCr of 0.99 mg/dL). Liver Function Tests: Recent Labs  Lab 07/16/17 1404  AST 27  ALT 33  ALKPHOS 96  BILITOT 1.4*  PROT 5.6*  ALBUMIN 3.3*   No results for input(s): LIPASE, AMYLASE in the last 168 hours. No results for input(s): AMMONIA in the last 168 hours. Coagulation Profile: No results for input(s): INR, PROTIME in the last 168 hours. Cardiac Enzymes: No results for input(s): CKTOTAL, CKMB, CKMBINDEX, TROPONINI in the last 168 hours. BNP (last 3 results) No results for input(s): PROBNP in the last 8760 hours. HbA1C: No results for input(s): HGBA1C in the last 72 hours. CBG: No results for input(s): GLUCAP in the last 168 hours. Lipid Profile: No results for input(s): CHOL, HDL, LDLCALC, TRIG, CHOLHDL, LDLDIRECT in the last 72 hours. Thyroid Function Tests: No results for input(s): TSH, T4TOTAL, FREET4, T3FREE, THYROIDAB in the last 72 hours. Anemia Panel: No results for input(s): VITAMINB12, FOLATE, FERRITIN, TIBC, IRON, RETICCTPCT in the last 72 hours. Sepsis Labs: No results for input(s): PROCALCITON, LATICACIDVEN in the last 168 hours.  Recent Results (from the past 240 hour(s))  Respiratory Panel by PCR     Status: Abnormal   Collection Time: 07/16/17  7:59 PM  Result Value Ref Range Status   Adenovirus NOT DETECTED NOT DETECTED Final   Coronavirus 229E NOT DETECTED NOT DETECTED Final   Coronavirus HKU1 NOT DETECTED NOT DETECTED Final   Coronavirus NL63 NOT DETECTED NOT DETECTED Final   Coronavirus OC43 NOT DETECTED NOT  DETECTED Final   Metapneumovirus NOT DETECTED NOT DETECTED Final   Rhinovirus / Enterovirus DETECTED (Stephen Vazquez) NOT DETECTED Final   Influenza [name] NOT DETECTED NOT DETECTED Final   Influenza B NOT DETECTED NOT DETECTED Final   Parainfluenza Virus 1 NOT DETECTED NOT DETECTED Final   Parainfluenza Virus 2 NOT DETECTED NOT DETECTED Final   Parainfluenza Virus 3 NOT DETECTED NOT DETECTED Final   Parainfluenza Virus 4 NOT DETECTED NOT DETECTED Final   Respiratory Syncytial Virus NOT DETECTED NOT DETECTED Final   Bordetella pertussis NOT DETECTED NOT DETECTED Final   Chlamydophila pneumoniae NOT DETECTED NOT DETECTED Final   Mycoplasma pneumoniae NOT DETECTED NOT DETECTED Final    Comment: Performed at [facility], [address] [place], [place] [address]         Radiology Studies: Dg Chest 2 View  Result Date: 07/16/2017 CLINICAL DATA:  Worsening cough, congestion and dyspnea EXAM: CHEST  2 VIEW COMPARISON:  07/13/2017 FINDINGS: The heart size and mediastinal contours are within normal limits. No  pneumonic consolidations or CHF. Chronic subpleural areas of interstitial prominence likely reflecting atelectasis and/or fibrosis are identified along the periphery of the right lung and left lung base. The visualized skeletal structures are unremarkable. IMPRESSION: No active cardiopulmonary disease. Electronically Signed   By: Tollie Ethavid  Kwon M.D.   On: 07/16/2017 13:31   Ct Head Wo Contrast  Result Date: 07/16/2017 CLINICAL DATA:  Headache over the last month.  Worsening weakness. EXAM: CT HEAD WITHOUT CONTRAST TECHNIQUE: Contiguous axial images were obtained from the base of the skull through the vertex without intravenous contrast. COMPARISON:  06/26/2016 FINDINGS: Brain: No evidence of malformation, atrophy, old or acute small or large vessel infarction, mass lesion, hemorrhage, hydrocephalus or extra-axial collection. No evidence of pituitary lesion. Vascular: No vascular calcification.  No  hyperdense vessels. Skull: Normal.  No fracture or focal bone lesion. Sinuses/Orbits: Visualized sinuses are clear. No fluid in the middle ears or mastoids. Visualized orbits are normal. Other: None significant IMPRESSION: Normal examination. No abnormality seen to explain the presenting symptoms. Electronically Signed   By: Paulina FusiMark  Shogry M.D.   On: 07/16/2017 16:15   Ct Chest W Contrast  Result Date: 07/16/2017 CLINICAL DATA:  Worsening shortness of breath with chest congestion and productive cough. History COPD. EXAM: CT CHEST WITH CONTRAST TECHNIQUE: Multidetector CT imaging of the chest was performed during intravenous contrast administration. CONTRAST:  75mL ISOVUE-300 IOPAMIDOL (ISOVUE-300) INJECTION 61% COMPARISON:  CT chest dated June 13, 2017. FINDINGS: Cardiovascular: Normal heart size. No pericardial effusion. Normal caliber thoracic aorta with scattered with atherosclerosis. Unchanged chronic dilatation of the main pulmonary artery. No central pulmonary embolism. Mediastinum/Nodes: Multiple previously seen prominent mediastinal lymph nodes have decreased in size, now subcentimeter. No mediastinal, hilar, or axillary lymphadenopathy. The thyroid gland, trachea, and esophagus demonstrate no significant abnormalities. Lungs/Pleura: Severe emphysematous changes again noted. Mild central peribronchial thickening. Previously seen ground-glass opacities in both lower lobes the right upper lobe have resolved. Unchanged subpleural right middle lobe nodule measuring 5 mm (series 6, image 99). Unchanged subpleural left lower lobe nodule measuring 6 mm (series 6, image 117). No pleural effusion or pneumothorax. Upper Abdomen: No acute abnormality. Unchanged severely atrophic left kidney. Musculoskeletal: No chest wall abnormality. No acute or significant osseous findings. IMPRESSION: 1. No acute intrathoracic process. Resolved ground-glass opacities in both lower lobes and the right upper lobe. 2. Severe  emphysema (ICD10-J43.9). Persistent central peribronchial thickening which may be smoking-related or secondary to acute bronchitis. 3. Interval decrease in size of previously seen prominent mediastinal lymph nodes, now subcentimeter. Electronically Signed   By: Obie DredgeWilliam T Derry M.D.   On: 07/16/2017 16:25        Scheduled Meds: . apixaban  5 mg Oral BID  . cloNIDine  0.1 mg Oral BID  . diltiazem  240 mg Oral Daily  . famotidine  20 mg Oral QHS  . fluticasone  1 spray Each Nare BID  . hydrochlorothiazide  25 mg Oral Daily  . ipratropium-albuterol  3 mL Nebulization QID  . methylPREDNISolone (SOLU-MEDROL) injection  80 mg Intravenous Q8H   Followed by  . [START ON 07/18/2017] predniSONE  40 mg Oral Q breakfast  . mometasone-formoterol  2 puff Inhalation BID  . pantoprazole  40 mg Oral Daily  . potassium chloride  40 mEq Oral Q4H  . propafenone  150 mg Oral Q8H  . sodium chloride flush  3 mL Intravenous Q12H  . traMADol  50 mg Oral TID   Continuous Infusions:   LOS: 1 day  Time spent: over 30 min    Lacretia Nicks, MD [facility] Pager [phone]  If 7PM-7AM, please contact night-coverage [email or web address] Password [facility] 07/17/2017, 10:53 AM

## 2017-07-17 NOTE — Evaluation (Signed)
Physical Therapy Evaluation Patient Details Name: Stephen Vazquez MRN: [id number] DOB: [birth year] Today's Date: 07/17/2017   History of Present Illness  62 yo male admitted with COPD exac. Hx of COPD, PAF, CKD.   Clinical Impression  On eval, pt was Min guard assist for mobility. He walked ~20'x2 with a rolling walker. O2 saturation level 90% on 3L [place] O2 during ambulation, dyspnea 2/4. Pt fatigues easily. Seated rest breaks taken throughout session. Recommend HHPT f/u and RW for safe ambulation until pt returns to baseline. Will follow during hospital stay.     Follow Up Recommendations Home health PT    Equipment Recommendations  Rolling walker with 5" wheels    Recommendations for Other Services       Precautions / Restrictions Precautions Precautions: Fall Precaution Comments: O2 dep. Monitor O2 sats.  Restrictions Weight Bearing Restrictions: No      Mobility  Bed Mobility Overal bed mobility: Needs Assistance Bed Mobility: Supine to Sit     Supine to sit: Supervision;HOB elevated     General bed mobility comments: for safety, lines. increased time.   Transfers Overall transfer level: Needs assistance Equipment used: Rolling walker (2 wheeled) Transfers: Sit to/from Stand Sit to Stand: Min guard         General transfer comment: close guard for safety. VCs hand placement.   Ambulation/Gait Ambulation/Gait assistance: Min guard Ambulation Distance (Feet): 20 Feet(x2) Assistive device: Rolling walker (2 wheeled) Gait Pattern/deviations: Step-through pattern;Decreased stride length     General Gait Details: close guard for safety. very slow gait speed. O2 sat 90% on 3L [place] O2, dyspnea 2/4. Seated rest break needed/taken.   Stairs            Wheelchair Mobility    Modified Rankin (Stroke Patients Only)       Balance Overall balance assessment: Needs assistance           Standing balance-Leahy Scale: Poor                                Pertinent Vitals/Pain Pain Assessment: Faces Faces Pain Scale: Hurts a little bit Pain Location: headache Pain Intervention(s): Monitored during session;Repositioned    Home Living Family/patient expects to be discharged to:: Private residence Living Arrangements: Spouse/significant other   Type of Home: House Home Access: Stairs to enter   Secretary/administrator of Steps: 3 Home Layout: One level Home Equipment: Cane - single point      Prior Function Level of Independence: Needs assistance   Gait / Transfers Assistance Needed: using cane  ADL's / Homemaking Assistance Needed: wife helped with ADL and household tasks        Hand Dominance        Extremity/Trunk Assessment   Upper Extremity Assessment Upper Extremity Assessment: Defer to OT evaluation    Lower Extremity Assessment Lower Extremity Assessment: Generalized weakness    Cervical / Trunk Assessment Cervical / Trunk Assessment: Kyphotic  Communication      Cognition Arousal/Alertness: Awake/alert Behavior During Therapy: WFL for tasks assessed/performed Overall Cognitive Status: Within Functional Limits for tasks assessed                                        General Comments      Exercises     Assessment/Plan    PT Assessment Patient needs  continued PT services  PT Problem List Decreased strength;Decreased activity tolerance;Decreased mobility;Decreased balance;Decreased knowledge of use of DME       PT Treatment Interventions DME instruction;Gait training;Functional mobility training;Therapeutic activities;Balance training;Patient/family education;Therapeutic exercise    PT Goals (Current goals can be found in the Care Plan section)  Acute Rehab PT Goals Patient Stated Goal: to get better and get home PT Goal Formulation: With patient Time For Goal Achievement: 07/31/17 Potential to Achieve Goals: Good    Frequency Min 3X/week   Barriers to  discharge        Co-evaluation               AM-PAC PT "6 Clicks" Daily Activity  Outcome Measure Difficulty turning over in bed (including adjusting bedclothes, sheets and blankets)?: A Little Difficulty moving from lying on back to sitting on the side of the bed? : A Little Difficulty sitting down on and standing up from a chair with arms (e.g., wheelchair, bedside commode, etc,.)?: A Little Help needed moving to and from a bed to chair (including a wheelchair)?: A Little Help needed walking in hospital room?: A Little Help needed climbing 3-5 steps with a railing? : A Little 6 Click Score: 18    End of Session Equipment Utilized During Treatment: Gait belt;Oxygen Activity Tolerance: Patient limited by fatigue Patient left: in chair;with call bell/phone within reach;with chair alarm set   PT Visit Diagnosis: Muscle weakness (generalized) (M62.81);Difficulty in walking, not elsewhere classified (R26.2)    Time: 1030-1047 PT Time Calculation (min) (ACUTE ONLY): 17 min   Charges:   PT Evaluation $PT Eval Low Complexity: 1 Low     PT G Codes:          Rebeca AlertJannie Erdine Hulen, MPT Pager: [phone]

## 2017-07-18 LAB — BASIC METABOLIC PANEL
ANION GAP: 6 (ref 5–15)
BUN: 36 mg/dL — ABNORMAL HIGH (ref 6–20)
CO2: 32 mmol/L (ref 22–32)
Calcium: 9.2 mg/dL (ref 8.9–10.3)
Chloride: 94 mmol/L — ABNORMAL LOW (ref 101–111)
Creatinine, Ser: 0.98 mg/dL (ref 0.61–1.24)
GFR calc non Af Amer: >60 mL/min (ref >60)
GLUCOSE: 133 mg/dL — AB (ref 65–99)
POTASSIUM: 4.7 mmol/L (ref 3.5–5.1)
Sodium: 132 mmol/L — ABNORMAL LOW (ref 135–145)

## 2017-07-18 LAB — CBC
HEMATOCRIT: 35.6 % — AB (ref 39.0–52.0)
Hemoglobin: 12.2 g/dL — ABNORMAL LOW (ref 13.0–17.0)
MCH: 29.9 pg (ref 26.0–34.0)
MCHC: 34.3 g/dL (ref 30.0–36.0)
MCV: 87.3 fL (ref 78.0–100.0)
Platelets: 148 10*3/uL — ABNORMAL LOW (ref 150–400)
RBC: 4.08 MIL/uL — AB (ref 4.22–5.81)
RDW: 15.3 % (ref 11.5–15.5)
WBC: 15.6 10*3/uL — AB (ref 4.0–10.5)

## 2017-07-18 LAB — MAGNESIUM: Magnesium: 1.9 mg/dL (ref 1.7–2.4)

## 2017-07-18 MED ORDER — ALUM & MAG HYDROXIDE-SIMETH 200-200-20 MG/5ML PO SUSP
30.0000 mL | ORAL | Status: DC | PRN
  Administered 2017-07-18 (×2): 30 mL via ORAL
  Filled 2017-07-18 (×2): qty 30

## 2017-07-18 MED ORDER — GI COCKTAIL ~~LOC~~
30.0000 mL | Freq: Once | ORAL | Status: AC
  Administered 2017-07-18: 30 mL via ORAL
  Filled 2017-07-18: qty 30

## 2017-07-18 NOTE — Progress Notes (Signed)
PROGRESS NOTE    Stephen Vazquez  ZOX:[id number] DOB: September 16, 1955 DOA: 07/16/2017 PCP: Simone Curia, MD    Brief Narrative:  Stephen Vazquez is Stephen Vazquez 62 y.o. male with Stephen Vazquez history of 2L-dependent COPD, PAF on eliquis who presented to the ED with one month of progressive weakness during which time he noted swelling in his face/head. He presented to multiple providers during this time for cough and dyspnea, receiving multiple antibiotics and steroid tapers with temporary improvement in respiratory symptoms, though over the past few days he's had severe, constant dyspnea with any exertion, nonproductive cough. He denies sick contacts, got his flu shot, stopped smoking 14 years ago. He's had COPD for >5 years, compliant with medications, does not have Stephen Vazquez pulmonologist.   Assessment & Plan:   Principal Problem:   COPD with acute exacerbation (HCC) Active Problems:   Acute on chronic respiratory failure (HCC)   Panlobular emphysema (HCC)   GERD (gastroesophageal reflux disease)   AF (paroxysmal atrial fibrillation) (HCC)   Hypokalemia   Weakness   Insomnia   Acute on chronic hypoxic respiratory failure: Due to acute bronchitis/COPD exacerbation. Severe panlobular emphysema noted on CT. Stopped smoking 14 years ago.  - IV steroids -> now on prednisone - Continue advair (dulera sub) - Scheduled duoneb, prn albuterol - + rhinovirus - CM consulted - Really needs pulmonology as outpatient.    Weakness: no focal deficits - PT/OT consulted -> SNF vs home health  Facial fullness/headache: Without airway compromise or focal swelling, suspect this could be related to cushingoid habitus vs congestion with +rhinovirus. CT head normal. - Monitor   Chest discomfort: reproducible on exam.  Will check EKG.  Continue to monitor, likely msk given reproducibility.  GERD: has PPI, will order GI cocktail  Leukocytosis: 2/2 sterodis  Hypokalemia:  - follow up mag, replete prn  HTN: - Restart home  medications: clonidine, HCTZ  PAF and SSS: Followed by Dr. Rhona Leavens, [facility].  - Check ECG, troponin wnl.  - Continue eliquis, diltiazem, propafenone. No longer on BB.   GERD: Chronic, stable - Home medications  Insomnia:  - Home med ordered  Chronic pain:  - Continue home medications.   DVT prophylaxis: apixiban Code Status: full  Family Communication: none at bedside Disposition Plan: pending improvement  Consultants:   none  Procedures: (Don't include imaging studies which can be auto populated. Include things that cannot be auto populated i.e. Echo, Carotid and venous dopplers, Foley, Bipap, HD, tubes/drains, wound vac, central lines etc)  none  Antimicrobials: (specify start and planned stop date. Auto populated tables are space occupying and do not give end dates) Anti-infectives (From admission, onward)   None     Subjective: Notes chest discomfort.  On off for Stephen Vazquez few months.  On R side.  Reproducible. Also note reflux.  HA waxes and wanes, possible congestion?  Objective: Vitals:   07/17/17 2100 07/18/17 0451 07/18/17 0737 07/18/17 0914  BP: (!) 133/94 126/64  (!) 152/93  Pulse: 86 76  85  Resp: 18 18    Temp: 98.8 F (37.1 C) 98.8 F (37.1 C)    TempSrc: Oral Oral    SpO2: 97% 97% 98%   Weight:      Height:        Intake/Output Summary (Last 24 hours) at 07/18/2017 1032 Last data filed at 07/18/2017 0600 Gross per 24 hour  Intake 240 ml  Output 100 ml  Net 140 ml   Filed Weights   07/16/17 1254  07/16/17 1953  Weight: 64.9 kg (143 lb) 59.1 kg (130 lb 4.7 oz)    Examination:  General: No acute distress. Neck: no palpable LAD Cardiovascular: Heart sounds show Tolulope Pinkett regular rate, and rhythm. No gallops or rubs. No murmurs. No JVD. Lungs: no increased wob, scattered wheezing Abdomen: Soft, nontender, nondistended with normal active bowel sounds. No masses. No hepatosplenomegaly. Neurological: Alert and oriented 3. Moves all extremities  4. Cranial nerves II through XII grossly intact. Skin: Warm and dry. No rashes or lesions. Extremities: No clubbing or cyanosis. No edema.  Psychiatric: Mood and affect are normal. Insight and judgment are appropriate.  Data Reviewed: I have personally reviewed following labs and imaging studies  CBC: Recent Labs  Lab 07/16/17 1404 07/18/17 0516  WBC 13.3* 15.6*  HGB 13.1 12.2*  HCT 37.5* 35.6*  MCV 86.2 87.3  PLT 142* 148*   Basic Metabolic Panel: Recent Labs  Lab 07/16/17 1404 07/17/17 0501 07/18/17 0516  NA 134* 133* 132*  K 2.7* 3.2* 4.7  CL 91* 94* 94*  CO2 32 27 32  GLUCOSE 135* 204* 133*  BUN 35* 31* 36*  CREATININE 0.96 0.99 0.98  CALCIUM 8.7* 8.9 9.2  MG 2.0 2.0 1.9   GFR: Estimated Creatinine Clearance: 67 mL/min (by C-G formula based on SCr of 0.98 mg/dL). Liver Function Tests: Recent Labs  Lab 07/16/17 1404  AST 27  ALT 33  ALKPHOS 96  BILITOT 1.4*  PROT 5.6*  ALBUMIN 3.3*   No results for input(s): LIPASE, AMYLASE in the last 168 hours. No results for input(s): AMMONIA in the last 168 hours. Coagulation Profile: No results for input(s): INR, PROTIME in the last 168 hours. Cardiac Enzymes: No results for input(s): CKTOTAL, CKMB, CKMBINDEX, TROPONINI in the last 168 hours. BNP (last 3 results) No results for input(s): PROBNP in the last 8760 hours. HbA1C: No results for input(s): HGBA1C in the last 72 hours. CBG: No results for input(s): GLUCAP in the last 168 hours. Lipid Profile: No results for input(s): CHOL, HDL, LDLCALC, TRIG, CHOLHDL, LDLDIRECT in the last 72 hours. Thyroid Function Tests: No results for input(s): TSH, T4TOTAL, FREET4, T3FREE, THYROIDAB in the last 72 hours. Anemia Panel: No results for input(s): VITAMINB12, FOLATE, FERRITIN, TIBC, IRON, RETICCTPCT in the last 72 hours. Sepsis Labs: No results for input(s): PROCALCITON, LATICACIDVEN in the last 168 hours.  Recent Results (from the past 240 hour(s))  Respiratory  Panel by PCR     Status: Abnormal   Collection Time: 07/16/17  7:59 PM  Result Value Ref Range Status   Adenovirus NOT DETECTED NOT DETECTED Final   Coronavirus 229E NOT DETECTED NOT DETECTED Final   Coronavirus HKU1 NOT DETECTED NOT DETECTED Final   Coronavirus NL63 NOT DETECTED NOT DETECTED Final   Coronavirus OC43 NOT DETECTED NOT DETECTED Final   Metapneumovirus NOT DETECTED NOT DETECTED Final   Rhinovirus / Enterovirus DETECTED (Shirell Struthers) NOT DETECTED Final   Influenza Katy Brickell NOT DETECTED NOT DETECTED Final   Influenza B NOT DETECTED NOT DETECTED Final   Parainfluenza Virus 1 NOT DETECTED NOT DETECTED Final   Parainfluenza Virus 2 NOT DETECTED NOT DETECTED Final   Parainfluenza Virus 3 NOT DETECTED NOT DETECTED Final   Parainfluenza Virus 4 NOT DETECTED NOT DETECTED Final   Respiratory Syncytial Virus NOT DETECTED NOT DETECTED Final   Bordetella pertussis NOT DETECTED NOT DETECTED Final   Chlamydophila pneumoniae NOT DETECTED NOT DETECTED Final   Mycoplasma pneumoniae NOT DETECTED NOT DETECTED Final    Comment: Performed  at [facility], [address], [place], [place] [address]         Radiology Studies: Dg Chest 2 View  Result Date: 07/16/2017 CLINICAL DATA:  Worsening cough, congestion and dyspnea EXAM: CHEST  2 VIEW COMPARISON:  07/13/2017 FINDINGS: The heart size and mediastinal contours are within normal limits. No pneumonic consolidations or CHF. Chronic subpleural areas of interstitial prominence likely reflecting atelectasis and/or fibrosis are identified along the periphery of the right lung and left lung base. The visualized skeletal structures are unremarkable. IMPRESSION: No active cardiopulmonary disease. Electronically Signed   By: Tollie Eth M.D.   On: 07/16/2017 13:31   Ct Head Wo Contrast  Result Date: 07/16/2017 CLINICAL DATA:  Headache over the last month.  Worsening weakness. EXAM: CT HEAD WITHOUT CONTRAST TECHNIQUE: Contiguous axial images were obtained from the  base of the skull through the vertex without intravenous contrast. COMPARISON:  06/26/2016 FINDINGS: Brain: No evidence of malformation, atrophy, old or acute small or large vessel infarction, mass lesion, hemorrhage, hydrocephalus or extra-axial collection. No evidence of pituitary lesion. Vascular: No vascular calcification.  No hyperdense vessels. Skull: Normal.  No fracture or focal bone lesion. Sinuses/Orbits: Visualized sinuses are clear. No fluid in the middle ears or mastoids. Visualized orbits are normal. Other: None significant IMPRESSION: Normal examination. No abnormality seen to explain the presenting symptoms. Electronically Signed   By: Paulina Fusi M.D.   On: 07/16/2017 16:15   Ct Chest W Contrast  Result Date: 07/16/2017 CLINICAL DATA:  Worsening shortness of breath with chest congestion and productive cough. History COPD. EXAM: CT CHEST WITH CONTRAST TECHNIQUE: Multidetector CT imaging of the chest was performed during intravenous contrast administration. CONTRAST:  75mL ISOVUE-300 IOPAMIDOL (ISOVUE-300) INJECTION 61% COMPARISON:  CT chest dated June 13, 2017. FINDINGS: Cardiovascular: Normal heart size. No pericardial effusion. Normal caliber thoracic aorta with scattered with atherosclerosis. Unchanged chronic dilatation of the main pulmonary artery. No central pulmonary embolism. Mediastinum/Nodes: Multiple previously seen prominent mediastinal lymph nodes have decreased in size, now subcentimeter. No mediastinal, hilar, or axillary lymphadenopathy. The thyroid gland, trachea, and esophagus demonstrate no significant abnormalities. Lungs/Pleura: Severe emphysematous changes again noted. Mild central peribronchial thickening. Previously seen ground-glass opacities in both lower lobes the right upper lobe have resolved. Unchanged subpleural right middle lobe nodule measuring 5 mm (series 6, image 99). Unchanged subpleural left lower lobe nodule measuring 6 mm (series 6, image 117). No  pleural effusion or pneumothorax. Upper Abdomen: No acute abnormality. Unchanged severely atrophic left kidney. Musculoskeletal: No chest wall abnormality. No acute or significant osseous findings. IMPRESSION: 1. No acute intrathoracic process. Resolved ground-glass opacities in both lower lobes and the right upper lobe. 2. Severe emphysema (ICD10-J43.9). Persistent central peribronchial thickening which may be smoking-related or secondary to acute bronchitis. 3. Interval decrease in size of previously seen prominent mediastinal lymph nodes, now subcentimeter. Electronically Signed   By: Obie Dredge M.D.   On: 07/16/2017 16:25        Scheduled Meds: . acetaminophen  1,000 mg Oral Q8H  . apixaban  5 mg Oral BID  . cloNIDine  0.1 mg Oral BID  . diltiazem  240 mg Oral Daily  . famotidine  20 mg Oral QHS  . fluticasone  1 spray Each Nare BID  . gi cocktail  30 mL Oral Once  . hydrochlorothiazide  25 mg Oral Daily  . ipratropium-albuterol  3 mL Nebulization QID  . mometasone-formoterol  2 puff Inhalation BID  . pantoprazole  40  mg Oral Daily  . predniSONE  40 mg Oral Q breakfast  . propafenone  150 mg Oral Q8H  . sodium chloride flush  3 mL Intravenous Q12H  . traMADol  50 mg Oral TID   Continuous Infusions:   LOS: 2 days    Time spent: over 30 min    Lacretia Nicksaldwell Powell, MD [facility] Pager [phone]  If 7PM-7AM, please contact night-coverage [email or web address] Password [email or web address] 07/18/2017, 10:32 AM

## 2017-07-18 NOTE — Care Management Note (Signed)
Case Management Note  Patient Details  Name: Stephen CarrowBilly Farrell Vazquez MRN: [id number] Date of Birth: 07/31/1955  Subjective/Objective:  62 y/o m admitted w/COPD. From home. Has spouse support. Home 02, home neb machine-[facility] dme.PT-recc HHPT,ambulates 6020ft min guard,recc rw, OT-recc SNF. Patient has medicaid. Spoke to patient about d/c plan & PT/OT recc, likely not covered by medicaid,& they cannot private pay for SNF,  They agree to home w/HHC,dme. [facility] [place] [facility]OT/aide,sw-dme-need 3n1,rw-[facility]-[name].faxed rw,3n1 order w/confirmation-they will deliver to patient rm prior d/c. [facility]. Will need ambulance transp @ d/c by PTAR.               Action/Plan:d/c home w/HHC/dme/PTAR   Expected Discharge Date:  (unknown)               Expected Discharge Plan:  Home w Home Health Services  In-House Referral:  Clinical Social Work  Discharge planning Services  CM Consult  Post Acute Care Choice:    Choice offered to:  Patient  DME Arranged:    DME Agency:     HH Arranged:    HH Agency:     Status of Service:  In process, will continue to follow  If discussed at Long Length of Stay Meetings, dates discussed:    Additional Comments:  Lanier ClamMahabir, Aiyannah Fayad, RN 07/18/2017, 11:43 AM

## 2017-07-18 NOTE — Care Management Note (Signed)
Case Management Note  Patient Details  Name: Verne CarrowBilly Farrell Yadav MRN: [id number] Date of Birth: 06/29/1956  Subjective/Objective:  [facility]irmed [facility] rep Clydie BraunKaren services they can provide under medicaid-HHRN/PT/SW-ordered-MD notified.                   Action/Plan:d/c home w/HHC/dme([facility],3n1 to deliver to rm prior d/c/PTAR   Expected Discharge Date:  (unknown)               Expected Discharge Plan:  Home w Home Health Services  In-House Referral:  Clinical Social Work  Discharge planning Services  CM Consult  Post Acute Care Choice:  Durable Medical Equipment(Active w/[facility]-home 02,neb machine) Choice offered to:  Patient  DME Arranged:  3-N-1, Walker rolling DME Agency:  Other - Comment([facility])  HH Arranged:  RN, PT, Social Work [facility]  [facility]  Status of Service:  In process, will continue to follow  If discussed at Long Length of Stay Meetings, dates discussed:    Additional Comments:  Lanier ClamMahabir, Jeyson Deshotel, RN 07/18/2017, 12:04 PM

## 2017-07-18 NOTE — Progress Notes (Signed)
Patient complained of chest pain at level 3 on 0-10 scale. Nurse paged provider. Per Dr. Lowell GuitarPowell, EKG was performed earlier today. Give patient Maalox. Nurse administered Maalox.

## 2017-07-18 NOTE — Progress Notes (Signed)
RT gave the Pt's TX early because he didn't finish all of his last TX due to going to the bathrrom. This is why the TX is off schedule

## 2017-07-19 LAB — CBC
HEMATOCRIT: 34.4 % — AB (ref 39.0–52.0)
Hemoglobin: 11.6 g/dL — ABNORMAL LOW (ref 13.0–17.0)
MCH: 29.4 pg (ref 26.0–34.0)
MCHC: 33.7 g/dL (ref 30.0–36.0)
MCV: 87.1 fL (ref 78.0–100.0)
Platelets: 147 10*3/uL — ABNORMAL LOW (ref 150–400)
RBC: 3.95 MIL/uL — AB (ref 4.22–5.81)
RDW: 15 % (ref 11.5–15.5)
WBC: 12.7 10*3/uL — AB (ref 4.0–10.5)

## 2017-07-19 LAB — BASIC METABOLIC PANEL
ANION GAP: 10 (ref 5–15)
BUN: 35 mg/dL — ABNORMAL HIGH (ref 6–20)
CO2: 30 mmol/L (ref 22–32)
Calcium: 8.7 mg/dL — ABNORMAL LOW (ref 8.9–10.3)
Chloride: 90 mmol/L — ABNORMAL LOW (ref 101–111)
Creatinine, Ser: 0.92 mg/dL (ref 0.61–1.24)
GFR calc non Af Amer: >60 mL/min (ref >60)
Glucose, Bld: 145 mg/dL — ABNORMAL HIGH (ref 65–99)
POTASSIUM: 4.2 mmol/L (ref 3.5–5.1)
Sodium: 130 mmol/L — ABNORMAL LOW (ref 135–145)

## 2017-07-19 LAB — MAGNESIUM: Magnesium: 2.4 mg/dL (ref 1.7–2.4)

## 2017-07-19 MED ORDER — PREDNISONE 20 MG PO TABS: ORAL_TABLET | ORAL | 0 refills | Status: AC

## 2017-07-19 NOTE — Progress Notes (Signed)
Patient discharged home with family, discharge instructions/prescription given and explained to patient/family and they verbalized understanding, denies any distress. Accompanied home by brother, transported to the car by staff via wheelchair.

## 2017-07-19 NOTE — Discharge Summary (Signed)
Physician Discharge Summary  Stephen Vazquez WUJ:[id number] DOB: 1955/08/09 DOA: 07/16/2017  PCP: Stephen Curia, MD  Admit date: 07/16/2017 Discharge date: 07/19/2017  Time spent: over 30 minutes  Recommendations for Outpatient Follow-up:  1. Follow up outpatient CBC/CMP 2. Rec outpatient pulm referral 3. Follow up facial swelling, consider additional imaging if it does not improve or worsens despite d/c steroids   Discharge Diagnoses:  Principal Problem:   COPD with acute exacerbation (HCC) Active Problems:   Acute on chronic respiratory failure (HCC)   Panlobular emphysema (HCC)   GERD (gastroesophageal reflux disease)   AF (paroxysmal atrial fibrillation) (HCC)   Hypokalemia   Weakness   Insomnia   Discharge Condition: stable  Diet recommendation: heart healthy  Filed Weights   07/16/17 1254 07/16/17 1953  Weight: 64.9 kg (143 lb) 59.1 kg (130 lb 4.7 oz)    History of present illness:  Stephen Cheuvront Jonesis Stephen Vazquez 61 y.o.malewitha history of 2L-dependent COPD, PAF on eliquis who presented to the ED with one month of progressive weakness during which time he noted swelling in his face/head. He presented to multiple providers during this time for cough and dyspnea, receiving multiple antibiotics and steroid tapers with temporary improvement in respiratory symptoms, though over the past few days he's had severe, constant dyspnea with any exertion, nonproductive cough. He denies sick contacts, got his flu shot, stopped smoking 14 years ago. He's had COPD for >5 years, compliant with medications, does not have Stephen Vazquez pulmonologist.  Hospital Course:  Acute on chronic hypoxic respiratory failure  Positive Rhinovirus: Due to acute bronchitis/COPD exacerbation. Severe panlobular emphysema noted on CT. Stopped smoking 14 years ago.  - Improved with steroids, nebs.  Discharged on steroid taper.  - Continue advair (dulera sub) - Scheduled duoneb, prn albuterol - + rhinovirus   Weakness:  no focal deficits - PT/OT consulted -> recommended SNF vs home health.  Pt discharged with home health services.   Facial fullness/headache: Without airway compromise or focal swelling, suspect this could be related to cushingoid habitus vs congestion with +rhinovirus.CT head normal.  Discharged with steroid taper, if this does not improve or worsens would consider additional imaging.  Chest discomfort: reproducible on exam.  EKG nsr.  Also having GERD sx and improvement with GI cocktail.  GERD: has PPI, will order GI cocktail  Leukocytosis: 2/2 sterodis  Hypokalemia:  - follow up mag, replete prn  HTN: - Restart home medications: clonidine, HCTZ  PAF and SSS: Followed by Dr. Rhona Vazquez, [facility].  - Check ECG, troponin wnl.  - Continue eliquis, diltiazem, propafenone. No longer on BB.   GERD: Chronic, stable - Home medications  Insomnia:  - Home med ordered  Chronic pain:  - Continue home medications.  Procedures:  none (i.e. Studies not automatically included, echos, thoracentesis, etc; not x-rays)  Consultations:  none  Discharge Exam: Vitals:   07/19/17 1242 07/19/17 1248  BP:    Pulse:    Resp:    Temp:    SpO2: 96% 96%   Feeling better.  Still has HA, but better.  General: No acute distress. Cardiovascular: Heart sounds show Stephen Vazquez regular rate, and rhythm. No gallops or rubs. No murmurs. No JVD. Lungs: No increased work of breathing.  Scattered wheezing. Abdomen: Soft, nontender, nondistended with normal active bowel sounds. No masses. No hepatosplenomegaly. Neurological: Alert and oriented 3. Moves all extremities 4 with equal strength. Cranial nerves II through XII grossly intact. Skin: Warm and dry. No rashes or lesions. Extremities: No clubbing  or cyanosis. No edema. Pedal pulses 2+. Psychiatric: Mood and affect are normal. Insight and judgment are appropriate.   Discharge Instructions   Discharge Instructions    Call MD for:   difficulty breathing, headache or visual disturbances   Complete by:  As directed    Call MD for:  extreme fatigue   Complete by:  As directed    Call MD for:  persistant dizziness or light-headedness   Complete by:  As directed    Call MD for:  persistant nausea and vomiting   Complete by:  As directed    Call MD for:  redness, tenderness, or signs of infection (pain, swelling, redness, odor or green/yellow discharge around incision site)   Complete by:  As directed    Call MD for:  severe uncontrolled pain   Complete by:  As directed    Call MD for:  temperature >100.4   Complete by:  As directed    Diet - low sodium heart healthy   Complete by:  As directed    Discharge instructions   Complete by:  As directed    You were seen for Stephen Vazquez COPD exacerbation.  Your symptoms improved with steroids and nebulizers.  I will send you home with Stephen Vazquez steroid taper.  The swelling may be Stephen Vazquez result of chronic steroids, but if this is persistent, please follow up with your PCP to look into other potential causes.  Return if you have new, recurrent, or worsening symptoms.  Please have your PCP request records from this hospitalization so they know what was done.  Please ask your PCP about seeing Stephen Vazquez pulmonologist (lung doctor).   Increase activity slowly   Complete by:  As directed      Allergies as of 07/19/2017      Reactions   Shellfish Allergy Nausea And Vomiting   Ketorolac Palpitations   Levaquin [levofloxacin] Palpitations      Medication List    TAKE these medications   ADVAIR HFA 230-21 MCG/ACT inhaler Generic drug:  fluticasone-salmeterol Inhale 2 puffs into the lungs 2 (two) times daily.   cloNIDine 0.1 MG tablet Commonly known as:  CATAPRES Take 0.1 mg by mouth 2 (two) times daily.   diltiazem 240 MG 24 hr capsule Commonly known as:  TIAZAC Take 240 mg by mouth daily.   ELIQUIS 5 MG Tabs tablet Generic drug:  apixaban Take 5 mg by mouth 2 (two) times daily.   fluticasone 50  MCG/ACT nasal spray Commonly known as:  FLONASE Place 1 spray into the nose 2 (two) times daily.   furosemide 20 MG tablet Commonly known as:  LASIX Take 20 mg by mouth daily.   hydrochlorothiazide 25 MG tablet Commonly known as:  HYDRODIURIL Take 25 mg by mouth daily.   HYDROcodone-acetaminophen 5-325 MG tablet Commonly known as:  NORCO/VICODIN Take 1 tablet by mouth every 8 (eight) hours as needed for moderate pain.   nitroGLYCERIN 0.4 MG SL tablet Commonly known as:  NITROSTAT Place 0.4 mg under the tongue every 5 (five) minutes as needed for chest pain.   omeprazole 20 MG capsule Commonly known as:  PRILOSEC Take 20 mg by mouth daily.   predniSONE 20 MG tablet Commonly known as:  DELTASONE Take 2 tablets (40 mg total) by mouth daily with breakfast for 3 days, THEN 1.5 tablets (30 mg total) daily with breakfast for 3 days, THEN 1 tablet (20 mg total) daily with breakfast for 3 days, THEN 0.5 tablets (10 mg total) daily  with breakfast for 3 days. Start taking on:  07/20/2017   propafenone 150 MG tablet Commonly known as:  RYTHMOL Take 150 mg by mouth every 8 (eight) hours.   ranitidine 300 MG tablet Commonly known as:  ZANTAC Take 300 mg by mouth at bedtime.   tiotropium 18 MCG inhalation capsule Commonly known as:  SPIRIVA Place 18 mcg into inhaler and inhale daily.   traMADol 50 MG tablet Commonly known as:  ULTRAM Take 50 mg by mouth 3 (three) times daily.   VENTOLIN HFA 108 (90 Base) MCG/ACT inhaler Generic drug:  albuterol Inhale 1 puff into the lungs every 4 (four) hours as needed for wheezing or shortness of breath.   zolpidem 10 MG tablet Commonly known as:  AMBIEN Take 10 mg by mouth at bedtime as needed for sleep.            Durable Medical Equipment  (From admission, onward)        Start     Ordered   07/19/17 1258  DME 3-in-1  Once     07/19/17 1257   07/19/17 1258  For home use only DME Walker rolling  [facility])  Once    Question:   Patient needs Deanna Boehlke walker to treat with the following condition  Answer:  COPD (chronic obstructive pulmonary disease) (HCC)   07/19/17 1257   07/18/17 1143  For home use only DME 3 n 1  Once     07/18/17 1142   07/18/17 1143  For home use only DME Walker rolling  Once    Question:  Patient needs Kien Mirsky walker to treat with the following condition  Answer:  Unsteady gait   07/18/17 1143     Allergies  Allergen Reactions  . Shellfish Allergy Nausea And Vomiting  . Ketorolac Palpitations  . Levaquin [Levofloxacin] Palpitations   Follow-up Information    [facility] Follow up.   Specialty:  Home Health Services Why:  [facility] nursing,physical ltherapy,social worker Contact information: [address] [place] [address] [phone]        [facility] Follow up.   Why:  roling walker,bedside commode Contact information: [address] [place] [address] [phone]        Stephen Curia, MD Follow up.   Specialty:  Internal Medicine Contact information: [address] [place]  [place] [address] [phone]            The results of significant diagnostics from this hospitalization (including imaging, microbiology, ancillary and laboratory) are listed below for reference.    Significant Diagnostic Studies: Dg Chest 2 View  Result Date: 07/16/2017 CLINICAL DATA:  Worsening cough, congestion and dyspnea EXAM: CHEST  2 VIEW COMPARISON:  07/13/2017 FINDINGS: The heart size and mediastinal contours are within normal limits. No pneumonic consolidations or CHF. Chronic subpleural areas of interstitial prominence likely reflecting atelectasis and/or fibrosis are identified along the periphery of the right lung and left lung base. The visualized skeletal structures are unremarkable. IMPRESSION: No active cardiopulmonary disease. Electronically Signed   By: Tollie Eth M.D.   On: 07/16/2017 13:31   Ct Head Wo  Contrast  Result Date: 07/16/2017 CLINICAL DATA:  Headache over the last month.  Worsening weakness. EXAM: CT HEAD WITHOUT CONTRAST TECHNIQUE: Contiguous axial images were obtained from the base of the skull through the vertex without intravenous contrast. COMPARISON:  06/26/2016 FINDINGS: Brain: No evidence of malformation, atrophy, old or acute small or large vessel infarction,  mass lesion, hemorrhage, hydrocephalus or extra-axial collection. No evidence of pituitary lesion. Vascular: No vascular calcification.  No hyperdense vessels. Skull: Normal.  No fracture or focal bone lesion. Sinuses/Orbits: Visualized sinuses are clear. No fluid in the middle ears or mastoids. Visualized orbits are normal. Other: None significant IMPRESSION: Normal examination. No abnormality seen to explain the presenting symptoms. Electronically Signed   By: Paulina FusiMark  Shogry M.D.   On: 07/16/2017 16:15   Ct Chest W Contrast  Result Date: 07/16/2017 CLINICAL DATA:  Worsening shortness of breath with chest congestion and productive cough. History COPD. EXAM: CT CHEST WITH CONTRAST TECHNIQUE: Multidetector CT imaging of the chest was performed during intravenous contrast administration. CONTRAST:  75mL ISOVUE-300 IOPAMIDOL (ISOVUE-300) INJECTION 61% COMPARISON:  CT chest dated June 13, 2017. FINDINGS: Cardiovascular: Normal heart size. No pericardial effusion. Normal caliber thoracic aorta with scattered with atherosclerosis. Unchanged chronic dilatation of the main pulmonary artery. No central pulmonary embolism. Mediastinum/Nodes: Multiple previously seen prominent mediastinal lymph nodes have decreased in size, now subcentimeter. No mediastinal, hilar, or axillary lymphadenopathy. The thyroid gland, trachea, and esophagus demonstrate no significant abnormalities. Lungs/Pleura: Severe emphysematous changes again noted. Mild central peribronchial thickening. Previously seen ground-glass opacities in both lower lobes the right upper  lobe have resolved. Unchanged subpleural right middle lobe nodule measuring 5 mm (series 6, image 99). Unchanged subpleural left lower lobe nodule measuring 6 mm (series 6, image 117). No pleural effusion or pneumothorax. Upper Abdomen: No acute abnormality. Unchanged severely atrophic left kidney. Musculoskeletal: No chest wall abnormality. No acute or significant osseous findings. IMPRESSION: 1. No acute intrathoracic process. Resolved ground-glass opacities in both lower lobes and the right upper lobe. 2. Severe emphysema (ICD10-J43.9). Persistent central peribronchial thickening which may be smoking-related or secondary to acute bronchitis. 3. Interval decrease in size of previously seen prominent mediastinal lymph nodes, now subcentimeter. Electronically Signed   By: Obie DredgeWilliam T Derry M.D.   On: 07/16/2017 16:25    Microbiology: Recent Results (from the past 240 hour(s))  Respiratory Panel by PCR     Status: Abnormal   Collection Time: 07/16/17  7:59 PM  Result Value Ref Range Status   Adenovirus NOT DETECTED NOT DETECTED Final   Coronavirus 229E NOT DETECTED NOT DETECTED Final   Coronavirus HKU1 NOT DETECTED NOT DETECTED Final   Coronavirus NL63 NOT DETECTED NOT DETECTED Final   Coronavirus OC43 NOT DETECTED NOT DETECTED Final   Metapneumovirus NOT DETECTED NOT DETECTED Final   Rhinovirus / Enterovirus DETECTED (Jackalyn Haith) NOT DETECTED Final   Influenza Hasel Janish NOT DETECTED NOT DETECTED Final   Influenza B NOT DETECTED NOT DETECTED Final   Parainfluenza Virus 1 NOT DETECTED NOT DETECTED Final   Parainfluenza Virus 2 NOT DETECTED NOT DETECTED Final   Parainfluenza Virus 3 NOT DETECTED NOT DETECTED Final   Parainfluenza Virus 4 NOT DETECTED NOT DETECTED Final   Respiratory Syncytial Virus NOT DETECTED NOT DETECTED Final   Bordetella pertussis NOT DETECTED NOT DETECTED Final   Chlamydophila pneumoniae NOT DETECTED NOT DETECTED Final   Mycoplasma pneumoniae NOT DETECTED NOT DETECTED Final    Comment:  Performed at [facility], [address] [place], [place] [address]     Labs: Basic Metabolic Panel: Recent Labs  Lab 07/16/17 1404 07/17/17 0501 07/18/17 0516 07/19/17 0529  NA 134* 133* 132* 130*  K 2.7* 3.2* 4.7 4.2  CL 91* 94* 94* 90*  CO2 32 27 32 30  GLUCOSE 135* 204* 133* 145*  BUN 35* 31* 36* 35*  CREATININE  0.96 0.99 0.98 0.92  CALCIUM 8.7* 8.9 9.2 8.7*  MG 2.0 2.0 1.9 2.4   Liver Function Tests: Recent Labs  Lab 07/16/17 1404  AST 27  ALT 33  ALKPHOS 96  BILITOT 1.4*  PROT 5.6*  ALBUMIN 3.3*   No results for input(s): LIPASE, AMYLASE in the last 168 hours. No results for input(s): AMMONIA in the last 168 hours. CBC: Recent Labs  Lab 07/16/17 1404 07/18/17 0516 07/19/17 0529  WBC 13.3* 15.6* 12.7*  HGB 13.1 12.2* 11.6*  HCT 37.5* 35.6* 34.4*  MCV 86.2 87.3 87.1  PLT 142* 148* 147*   Cardiac Enzymes: No results for input(s): CKTOTAL, CKMB, CKMBINDEX, TROPONINI in the last 168 hours. BNP: BNP (last 3 results) No results for input(s): BNP in the last 8760 hours.  ProBNP (last 3 results) No results for input(s): PROBNP in the last 8760 hours.  CBG: No results for input(s): GLUCAP in the last 168 hours.     Signed:  Lacretia Nicks MD.  [facility] 07/19/2017, 1:01 PM

## 2017-07-19 NOTE — Progress Notes (Addendum)
Contacted [facility] to make aware of dc home today with HH. Contacted [facility] for a RW for home. Family will provided transport. Isidoro DonningAlesia Oluwatobi Ruppe RN CCM Case Mgmt phone [phone]

## 2017-08-07 ENCOUNTER — Other Ambulatory Visit (INDEPENDENT_AMBULATORY_CARE_PROVIDER_SITE_OTHER): Payer: Medicaid Other

## 2017-08-07 ENCOUNTER — Encounter: Payer: Self-pay | Admitting: Internal Medicine

## 2017-08-07 ENCOUNTER — Ambulatory Visit: Payer: Medicaid Other | Admitting: Internal Medicine

## 2017-08-07 VITALS — BP 104/70 | HR 86 | Ht 66.0 in | Wt 130.0 lb

## 2017-08-07 DIAGNOSIS — R0609 Other forms of dyspnea: Secondary | ICD-10-CM | POA: Diagnosis not present

## 2017-08-07 DIAGNOSIS — J9611 Chronic respiratory failure with hypoxia: Secondary | ICD-10-CM

## 2017-08-07 DIAGNOSIS — J449 Chronic obstructive pulmonary disease, unspecified: Secondary | ICD-10-CM

## 2017-08-07 LAB — CBC WITH DIFFERENTIAL/PLATELET
BASOS ABS: 0 10*3/uL (ref 0.0–0.1)
Basophils Relative: 0.1 % (ref 0.0–3.0)
EOS ABS: 0 10*3/uL (ref 0.0–0.7)
Eosinophils Relative: 0 % (ref 0.0–5.0)
HEMATOCRIT: 34.7 % — AB (ref 39.0–52.0)
Hemoglobin: 12.1 g/dL — ABNORMAL LOW (ref 13.0–17.0)
LYMPHS PCT: 2.3 % — AB (ref 12.0–46.0)
Lymphs Abs: 0.3 10*3/uL — ABNORMAL LOW (ref 0.7–4.0)
MCHC: 34.9 g/dL (ref 30.0–36.0)
MCV: 88.4 fl (ref 78.0–100.0)
MONOS PCT: 2.5 % — AB (ref 3.0–12.0)
Monocytes Absolute: 0.3 10*3/uL (ref 0.1–1.0)
Neutro Abs: 10.8 10*3/uL — ABNORMAL HIGH (ref 1.4–7.7)
Neutrophils Relative %: 95.1 % — ABNORMAL HIGH (ref 43.0–77.0)
PLATELETS: 173 10*3/uL (ref 150.0–400.0)
RBC: 3.93 Mil/uL — AB (ref 4.22–5.81)
RDW: 17.1 % — ABNORMAL HIGH (ref 11.5–15.5)
WBC: 11.3 10*3/uL — AB (ref 4.0–10.5)

## 2017-08-07 LAB — BRAIN NATRIURETIC PEPTIDE: Pro B Natriuretic peptide (BNP): 51 pg/mL (ref 0.0–100.0)

## 2017-08-07 LAB — TSH: TSH: 3.61 u[IU]/mL (ref 0.35–4.50)

## 2017-08-07 MED ORDER — OMEPRAZOLE 20 MG PO CPDR: DELAYED_RELEASE_CAPSULE | ORAL | Status: AC

## 2017-08-07 MED ORDER — TIOTROPIUM BROMIDE-OLODATEROL 2.5-2.5 MCG/ACT IN AERS: 2.0000 | INHALATION_SPRAY | Freq: Every day | RESPIRATORY_TRACT | 0 refills | Status: AC

## 2017-08-07 NOTE — Progress Notes (Signed)
Subjective:     Patient ID: Stephen Vazquez, male   DOB: Dec 28, 1955, 62 y.o.   MRN: [id number]  HPI  61 yowm quit smoking in 2005 p 82 pk-years "breathing ok" gradually getting worse x 2014 eferred to pulmonary clinic 08/07/2017 by Dr   Nedra Hai p another of many admits:  Admit date: 07/16/2017 Discharge date: 07/19/2017 Discharge Diagnoses:  Principal Problem:   COPD with acute exacerbation (HCC) Active Problems:   Acute on chronic respiratory failure (HCC)   Panlobular emphysema (HCC)   GERD (gastroesophageal reflux disease)   AF (paroxysmal atrial fibrillation) (HCC)   Hypokalemia   Weakness   Insomnia     History of present illness:  Stephen Vazquez a 61 y.o.malewitha history of 2L-dependent COPD, PAF on eliquis who presented to the ED with one month of progressive weakness during which time he noted swelling in his face/head. He presented to multiple providers during this time for cough and dyspnea, receiving multiple antibiotics and steroid tapers with temporary improvement in respiratory symptoms, though over the past few days he's had severe, constant dyspnea with any exertion, nonproductive cough. He denies sick contacts, got his flu shot, stopped smoking 14 years ago. He's had COPD for >5 years, compliant with medications, does not have a pulmonologist.  Hospital Course:  Acute on chronic hypoxic respiratory failure  Positive Rhinovirus: Due to acute bronchitis/COPD exacerbation. Severe panlobular emphysema noted on CT. Stopped smoking 14 years ago.  - Improved with steroids, nebs.  Discharged on steroid taper.  - Continue advair (dulera sub) - Scheduled duoneb, prn albuterol -+ rhinovirus   Weakness: no focal deficits - PT/OT consulted-> recommended SNF vs home health.  Pt discharged with home health services.   Facial fullness/headache: Without airway compromise or focal swelling, suspect this could be related to cushingoid habitus vs congestion with  +rhinovirus.CT head normal.  Discharged with steroid taper, if this does not improve or worsens would consider additional imaging.  Chest discomfort:reproducible on exam. EKG nsr. Also having GERD sx and improvement with GI cocktail.  GERD: has PPI, will order GI cocktail  Leukocytosis: 2/2 sterodis  Hypokalemia:  - follow up mag, replete prn  HTN: - Restart home medications: clonidine, HCTZ  PAF and WUJ:[id number] by Dr. Rhona Leavens, [facility].  - Check ECG, troponin wnl.  - Continue eliquis, diltiazem, propafenone. No longer on BB.   GERD: Chronic, stable - Home medications  Insomnia:  - Home med ordered  Chronic pain:  - Continue home medications.      08/07/2017 1st [facility] office visit/ Mariene Dickerman   Chief Complaint  Patient presents with  . Pulmonary Consult    Referred by Dr. Nedra Hai. Pt c/o DOE x 2-3 months. He states he can not walk 20 ft without getting SOB and "feeling like passing out".  He also c/o cough with green to grey sputum.  He uses his albuterol inhaler 4 x daily on average.   on prednisone 20 mg daily and omeprazole q d ac and 02 2 lpm sometimes 3lpm  "typically better x 24-48 h p d/c"  Then gradually worse and req admit and more pred/ Still doe room to room on 02 at best Comfortable hs on 2lpm s typical am flare  Has flutter valve/ ? Using  Very confused with details of care  No obvious patterns in  day to day or daytime variability or assoc   mucus plugs or hemoptysis or cp or chest tightness, subjective wheeze or overt sinus or hb  symptoms. No unusual exposure hx or h/o childhood pna/ asthma or knowledge of premature birth.  Sleeping ok 2 pills/ 2lpm  without nocturnal    exacerbation  of respiratory  c/o's or need for noct saba. Also denies any obvious fluctuation of symptoms with weather or environmental changes or other aggravating or alleviating factors except as outlined above   Current Allergies, Complete Past Medical History,  Past Surgical History, Family History, and Social History were reviewed in [facility] electronic medical record.  ROS  The following are not active complaints unless bolded Hoarseness, sore throat, dysphagia, dental problems, itching, sneezing,  nasal congestion or discharge of excess mucus or purulent secretions, ear ache,   fever, chills, sweats, unintended wt loss or wt gain, classically pleuritic or exertional cp,  orthopnea pnd or leg swelling, presyncope, palpitations, abdominal pain, anorexia, nausea, vomiting, diarrhea  or change in bowel habits or change in bladder habits, change in stools or change in urine, dysuria, hematuria,  rash, arthralgias, visual complaints, headache, numbness, weakness or ataxia or problems with walking or coordination,  change in mood/affect or memory.        Current Meds  Medication Sig  . albuterol (VENTOLIN HFA) 108 (90 Base) MCG/ACT inhaler Inhale 1 puff into the lungs every 4 (four) hours as needed for wheezing or shortness of breath.   Marland Kitchen. apixaban (ELIQUIS) 5 MG TABS tablet Take 5 mg by mouth 2 (two) times daily.  . cloNIDine (CATAPRES) 0.1 MG tablet Take 0.1 mg by mouth 2 (two) times daily.  Marland Kitchen. diltiazem (TIAZAC) 240 MG 24 hr capsule Take 240 mg by mouth daily.  . fluticasone (FLONASE) 50 MCG/ACT nasal spray Place 1 spray into the nose 2 (two) times daily.  . furosemide (LASIX) 20 MG tablet Take 20 mg by mouth daily.  . hydrochlorothiazide (HYDRODIURIL) 25 MG tablet Take 25 mg by mouth daily.  Marland Kitchen. HYDROcodone-acetaminophen (NORCO/VICODIN) 5-325 MG tablet Take 1 tablet by mouth every 8 (eight) hours as needed for moderate pain.   . nitroGLYCERIN (NITROSTAT) 0.4 MG SL tablet Place 0.4 mg under the tongue every 5 (five) minutes as needed for chest pain.   .     . OXYGEN 2lpm 24/7  AHP  . propafenone (RYTHMOL) 150 MG tablet Take 150 mg by mouth every 8 (eight) hours.  . ranitidine (ZANTAC) 300 MG tablet Take 300 mg by mouth at bedtime.  . traMADol  (ULTRAM) 50 MG tablet Take 50 mg by mouth 3 (three) times daily.  Marland Kitchen. zolpidem (AMBIEN) 10 MG tablet Take 10 mg by mouth at bedtime as needed for sleep.  .   fluticasone-salmeterol (ADVAIR HFA) 230-21 MCG/ACT inhaler Inhale 2 puffs into the lungs 2 (two) times daily.  Marland Kitchen.   omeprazole (PRILOSEC) 20 MG capsule Take 20 mg by mouth daily.  Marland Kitchen.   tiotropium (SPIRIVA) 18 MCG inhalation capsule Place 18 mcg into inhaler and inhale daily.            Review of Systems     Objective:   Physical Exam    w/c bound disheveled  elderly wm > state age  Wt Readings from Last 3 Encounters:  08/07/17 130 lb (59 kg)  07/16/17 130 lb 4.7 oz (59.1 kg)     Vital signs reviewed - Note on arrival 02 sats  92% on on 2lpm     HEENT: nl  turbinates bilaterally, and oropharynx. Nl external ear canals without cough reflex - moderate bilateral non-specific turbinate edema  / edentulous  NECK :  without JVD/Nodes/TM/ nl carotid upstrokes bilaterally   LUNGS: no acc muscle use,  Barrel chested distant pan exp rhonchi with pseudoweeze much better with plm    CV:  RRR  no s3 or murmur or increase in P2, and 1+ pitting both feet  ABD:  soft and nontender with nl inspiratory excursion in the supine position. No bruits or organomegaly appreciated, bowel sounds nl  MS:  Nl gait/ ext warm without deformities, calf tenderness, cyanosis or clubbing No obvious joint restrictions   SKIN: warm and dry without lesions    NEURO:  alert, approp, nl sensorium with  no motor or cerebellar deficits apparent.        I personally reviewed images and agree with radiology impression as follows:   Chest CT 07/16/17 1. No acute intrathoracic process. Resolved ground-glass opacities in both lower lobes and the right upper lobe. 2. Severe emphysema (ICD10-J43.9). Persistent central peribronchial thickening which may be smoking-related or secondary to acute bronchitis. 3. Interval decrease in size of previously seen  prominent mediastinal lymph nodes, now subcentimeter.   Labs ordered 08/07/2017   Alpha one AT screen    Labs ordered/ reviewed:      Chemistry      Component Value Date/Time   NA 130 (L) 07/19/2017 0529   K 4.2 07/19/2017 0529   CL 90 (L) 07/19/2017 0529   CO2 30 07/19/2017 0529   BUN 35 (H) 07/19/2017 0529   CREATININE 0.92 07/19/2017 0529      Component Value Date/Time   CALCIUM 8.7 (L) 07/19/2017 0529   ALKPHOS 96 07/16/2017 1404   AST 27 07/16/2017 1404   ALT 33 07/16/2017 1404   BILITOT 1.4 (H) 07/16/2017 1404        Lab Results  Component Value Date   WBC 11.3 (H) 08/07/2017   HGB 12.1 (L) 08/07/2017   HCT 34.7 (L) 08/07/2017   MCV 88.4 08/07/2017   PLT 173.0 08/07/2017       EOS                        0.0      08/07/2017       Lab Results  Component Value Date   TSH 3.61 08/07/2017     Lab Results  Component Value Date   PROBNP 51.0 08/07/2017          Assessment:

## 2017-08-07 NOTE — Patient Instructions (Addendum)
Adjust your oxygen with walking to keep it above 90%   For cough /congestion > use the flutter valve as much as possible  Plan A = Automatic =  stop symbicort / spiriva and start stiolto 2 pffs each am   Work on inhaler technique:  relax and gently blow all the way out then take a nice smooth deep breath back in, triggering the inhaler at same time you start breathing in.  Hold for up to 5 seconds if you can. Blow out thru nose. Rinse and gargle with water when done      Plan B = Backup Only use your albuterol as a rescue medication to be used if you can't catch your breath by resting or doing a relaxed purse lip breathing pattern.  - The less you use it, the better it will work when you need it. - Ok to use the inhaler up to 2 puffs  every 4 hours if you must but call for appointment if use goes up over your usual need - Don't leave home without it !!  (think of it like the spare tire for your car)   Plan C = Crisis - only use your albuterol nebulizer if you first try Plan B and it fails to help > ok to use the nebulizer up to every 4 hours but if start needing it regularly call for immediate appointment   Stop omeprazole   Pantoprazole (protonix) 40 mg   Take  30-60 min before first meal of the day and Pepcid (famotidine)  20 mg one @  bedtime until return to office - this is the best way to tell whether stomach acid is contributing to your problem.    GERD (REFLUX)  is an extremely common cause of respiratory symptoms just like yours , many times with no obvious heartburn at all.    It can be treated with medication, but also with lifestyle changes including elevation of the head of your bed (ideally with 6 inch  bed blocks),  Smoking cessation, avoidance of late meals, excessive alcohol, and avoid fatty foods, chocolate, peppermint, colas, red wine, and acidic juices such as orange juice.  NO MINT OR MENTHOL PRODUCTS SO NO COUGH DROPS   USE SUGARLESS CANDY INSTEAD (Jolley ranchers or  Stover's or Life Savers) or even ice chips will also do - the key is to swallow to prevent all throat clearing. NO OIL BASED VITAMINS - use powdered substitutes.    Please remember to go to the lab department downstairs in the basement  for your tests - we will call you with the results when they are available.      See Tammy NP w/in 2 weeks with all your medications, even over the counter meds, separated in two separate bags, the ones you take no matter what vs the ones you stop once you feel better and take only as needed when you feel you need them.   Tammy  will generate for you a new user friendly medication calendar that will put us all on the same page re: your medication use.     Without this process, it simply isn't possible to assure that we are providing  your outpatient care  with  the attention to detail we feel you deserve.   If we cannot assure that you're getting that kind of care,  then we cannot manage your problem effectively from this clinic.  Once you have seen Tammy and we are sure that we're  all on the same page with your medication use she will arrange follow up with me.

## 2017-08-08 ENCOUNTER — Telehealth: Payer: Self-pay | Admitting: Internal Medicine

## 2017-08-08 ENCOUNTER — Encounter: Payer: Self-pay | Admitting: Internal Medicine

## 2017-08-08 DIAGNOSIS — J9611 Chronic respiratory failure with hypoxia: Secondary | ICD-10-CM | POA: Insufficient documentation

## 2017-08-08 MED ORDER — PANTOPRAZOLE SODIUM 40 MG PO TBEC: 40.0000 mg | DELAYED_RELEASE_TABLET | Freq: Every day | ORAL | 3 refills | Status: AC

## 2017-08-08 NOTE — Assessment & Plan Note (Signed)
No evidence of significant thyroid dz or anemia or chf here > c/w approaching es copd

## 2017-08-08 NOTE — Assessment & Plan Note (Addendum)
Spirometry 08/07/2017  FEV1 0.79 (25%)  Ratio 42  4 h p last saba - 08/07/2017  After extensive coaching inhaler device  effectiveness =    90% p smi teaching > try stiolto 2 pffs each am    DDX of  difficult airways management almost all start with A and  include Adherence, Ace Inhibitors, Acid Reflux, Active Sinus Disease, Alpha 1 Antitripsin deficiency, Anxiety masquerading as Airways dz,  ABPA,  Allergy(esp in young), Aspiration (esp in elderly), Adverse effects of meds,  Active smokers, A bunch of PE's (a small clot burden can't cause this syndrome unless there is already severe underlying pulm or vascular dz with poor reserve) plus two Bs  = Bronchiectasis and Beta blocker use..and one C= CHF  Adherence is always the initial "prime suspect" and is a multilayered concern that requires a "trust but verify" approach in every patient - starting with knowing how to use medications, especially inhalers, correctly, keeping up with refills and understanding the fundamental difference between maintenance and prns vs those medications only taken for a very short course and then stopped and not refilled.  - see hfa teaching - return in 2 weeks with all meds in hand using a trust but verify approach to confirm accurate Medication  Reconciliation The principal here is that until we are certain that the  patients are doing what we've asked, it makes no sense to ask them to do more.    ? Acid (or non-acid) GERD > always difficult to exclude as up to 75% of pts in some series report no assoc GI/ Heartburn symptoms> rec max (24h)  acid suppression and diet restrictions/ reviewed and instructions given in writing.   ? Allergy /asthma component > most of his symptoms appear pseudoasthma related and actually seem to be getting worse on pred assoc with evolving cushingnoid appearance > try to avoid   ? Anxiety > usually at the bottom of this list of usual suspects but should be much higher on this pt's based on H and P  > may need clonazepam added at some point    ? Alpha one AT def > send screen   ? A bunch of pe's > unlikely on eliquis for af   ? chf > excluded with low bnp    Total time devoted to counseling  > 50 % of initial 60 min office visit:  review case with pt/ discussion of options/alternatives/ personally creating written customized instructions  in presence of pt  then going over those specific  Instructions directly with the pt including how to use all of the meds but in particular covering each new medication in detail and the difference between the maintenance= "automatic" meds and the prns using an action plan format for the latter (If this problem/symptom => do that organization reading Left to right).  Please see AVS from this visit for a full list of these instructions which I personally wrote for this pt and  are unique to this visit.

## 2017-08-08 NOTE — Telephone Encounter (Signed)
Call[facility] All[facility] pt needs script for Protonix 40mg  Per AVS of MW, pt is to be using Protonix 40mg  daily Placed order to pharmacy Nothing further needed

## 2017-08-08 NOTE — Assessment & Plan Note (Signed)
Reviewed goals of care = titrate to keep sats > 90% at all times

## 2017-08-12 LAB — RESPIRATORY ALLERGY PROFILE REGION II ~~LOC~~
Allergen, A. alternata, m6: <0.1 kU/L
Allergen, D pternoyssinus,d7: <0.1 kU/L
Allergen, Mouse Urine Protein, e78: <0.1 kU/L
Allergen, Oak,t7: <0.1 kU/L
Allergen, P. notatum, m1: <0.1 kU/L
Box Elder IgE: <0.1 kU/L
CLASS: 0
CLASS: 0
CLASS: 0
CLASS: 0
CLASS: 0
CLASS: 0
CLASS: 0
CLASS: 0
CLASS: 0
CLASS: 0
Cat Dander: <0.1 kU/L
Class: 0
Class: 0
Class: 0
Class: 0
Class: 0
Class: 0
Class: 0
Class: 0
Class: 0
Class: 0
Class: 0
Class: 0
Class: 0
Class: 0
Cockroach: <0.1 kU/L
D. farinae: <0.1 kU/L
Elm IgE: <0.1 kU/L
IGE (IMMUNOGLOBULIN E), SERUM: 14 kU/L (ref <=114)
Timothy Grass: <0.1 kU/L

## 2017-08-12 LAB — ALPHA-1 ANTITRYPSIN PHENOTYPE: A-1 Antitrypsin, Ser: 174 mg/dL (ref 83–199)

## 2017-08-12 LAB — INTERPRETATION:

## 2017-08-12 LAB — ALPHA-1-ANTITRYPSIN: A-1 Antitrypsin, Ser: 190 mg/dL (ref 83–199)

## 2017-08-13 ENCOUNTER — Telehealth: Payer: Self-pay | Admitting: Internal Medicine

## 2017-08-13 DIAGNOSIS — I4891 Unspecified atrial fibrillation: Secondary | ICD-10-CM

## 2017-08-13 DIAGNOSIS — N400 Enlarged prostate without lower urinary tract symptoms: Secondary | ICD-10-CM

## 2017-08-13 DIAGNOSIS — I10 Essential (primary) hypertension: Secondary | ICD-10-CM | POA: Diagnosis not present

## 2017-08-13 DIAGNOSIS — J441 Chronic obstructive pulmonary disease with (acute) exacerbation: Secondary | ICD-10-CM

## 2017-08-13 NOTE — Telephone Encounter (Signed)
Strongly prefer he be seen in er but if refuses then rec  Double the dose of prednisone he's on until better then back to present dose  Augmentin 875 mg take one pill twice daily  X 10 days - take at breakfast and supper with large glass of water.  It would help reduce the usual side effects (diarrhea and yeast infections) if you ate cultured yogurt at lunch.

## 2017-08-13 NOTE — Telephone Encounter (Signed)
Spoke with [employer]. She stated that the patient may have developed PNA over the weekend. Increased wheezing and fatigue. He has a productive cough with thick, green phlegm. He is having to sleep with 2 pillows behind him in order to be comfortable. He is not able to walk across his room without getting tired.   She wanted to know if it was possible for you to call in an abx for him.   [facility] [place].   MW, please advise. Thanks!

## 2017-08-13 NOTE — Telephone Encounter (Signed)
Spoke with [place] with [facility]. She is aware of MW's recommendations. Lyla SonCarrie spoke to the pt while I was on the phone and he agreed to go to the ED. The recommended prescriptions were NOT sent in as the pt agreed to go to the ED. Nothing further was needed.

## 2017-08-14 DIAGNOSIS — N400 Enlarged prostate without lower urinary tract symptoms: Secondary | ICD-10-CM | POA: Diagnosis not present

## 2017-08-14 DIAGNOSIS — I4891 Unspecified atrial fibrillation: Secondary | ICD-10-CM | POA: Diagnosis not present

## 2017-08-14 DIAGNOSIS — J441 Chronic obstructive pulmonary disease with (acute) exacerbation: Secondary | ICD-10-CM | POA: Diagnosis not present

## 2017-08-14 DIAGNOSIS — I10 Essential (primary) hypertension: Secondary | ICD-10-CM | POA: Diagnosis not present

## 2017-08-15 DIAGNOSIS — J441 Chronic obstructive pulmonary disease with (acute) exacerbation: Secondary | ICD-10-CM | POA: Diagnosis not present

## 2017-08-15 DIAGNOSIS — I10 Essential (primary) hypertension: Secondary | ICD-10-CM | POA: Diagnosis not present

## 2017-08-15 DIAGNOSIS — I4891 Unspecified atrial fibrillation: Secondary | ICD-10-CM | POA: Diagnosis not present

## 2017-08-15 DIAGNOSIS — N400 Enlarged prostate without lower urinary tract symptoms: Secondary | ICD-10-CM | POA: Diagnosis not present

## 2017-08-16 DIAGNOSIS — I4891 Unspecified atrial fibrillation: Secondary | ICD-10-CM | POA: Diagnosis not present

## 2017-08-16 DIAGNOSIS — J441 Chronic obstructive pulmonary disease with (acute) exacerbation: Secondary | ICD-10-CM | POA: Diagnosis not present

## 2017-08-16 DIAGNOSIS — I10 Essential (primary) hypertension: Secondary | ICD-10-CM | POA: Diagnosis not present

## 2017-08-16 DIAGNOSIS — N400 Enlarged prostate without lower urinary tract symptoms: Secondary | ICD-10-CM | POA: Diagnosis not present

## 2017-08-17 DIAGNOSIS — J441 Chronic obstructive pulmonary disease with (acute) exacerbation: Secondary | ICD-10-CM | POA: Diagnosis not present

## 2017-08-17 DIAGNOSIS — N400 Enlarged prostate without lower urinary tract symptoms: Secondary | ICD-10-CM | POA: Diagnosis not present

## 2017-08-17 DIAGNOSIS — I10 Essential (primary) hypertension: Secondary | ICD-10-CM | POA: Diagnosis not present

## 2017-08-17 DIAGNOSIS — I4891 Unspecified atrial fibrillation: Secondary | ICD-10-CM | POA: Diagnosis not present

## 2017-08-18 DIAGNOSIS — J441 Chronic obstructive pulmonary disease with (acute) exacerbation: Secondary | ICD-10-CM | POA: Diagnosis not present

## 2017-08-18 DIAGNOSIS — I10 Essential (primary) hypertension: Secondary | ICD-10-CM | POA: Diagnosis not present

## 2017-08-18 DIAGNOSIS — N400 Enlarged prostate without lower urinary tract symptoms: Secondary | ICD-10-CM | POA: Diagnosis not present

## 2017-08-18 DIAGNOSIS — I4891 Unspecified atrial fibrillation: Secondary | ICD-10-CM | POA: Diagnosis not present

## 2017-08-19 DIAGNOSIS — N400 Enlarged prostate without lower urinary tract symptoms: Secondary | ICD-10-CM | POA: Diagnosis not present

## 2017-08-19 DIAGNOSIS — R0602 Shortness of breath: Secondary | ICD-10-CM

## 2017-08-19 DIAGNOSIS — J441 Chronic obstructive pulmonary disease with (acute) exacerbation: Secondary | ICD-10-CM | POA: Diagnosis not present

## 2017-08-19 DIAGNOSIS — I4891 Unspecified atrial fibrillation: Secondary | ICD-10-CM | POA: Diagnosis not present

## 2017-08-19 DIAGNOSIS — I10 Essential (primary) hypertension: Secondary | ICD-10-CM | POA: Diagnosis not present

## 2017-08-25 ENCOUNTER — Encounter: Payer: Medicaid Other | Admitting: Adult Health

## 2017-11-12 DEATH — deceased

## 2019-09-13 IMAGING — CT CT HEAD W/O CM
3 series · 16 of 47 positions shown, 19 images · non-contrast
Comparison: 06/26/2016

CLINICAL DATA: Headache over the last month.  Worsening weakness.

EXAM:
CT HEAD WITHOUT CONTRAST
TECHNIQUE: Contiguous axial images were obtained from the base of the skull
through the vertex without intravenous contrast.

[Series 2: head wo · axial · 0.44mm/px · z∈[-293,-153]mm · 10 of 34 slices shown, 13 images]
[im 3/34  brain]
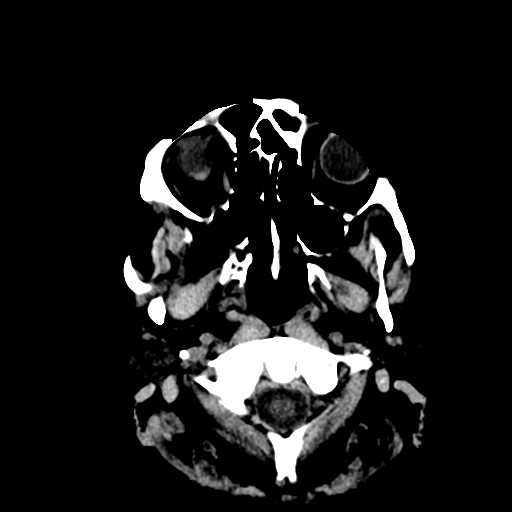
[im 3/34  bone]
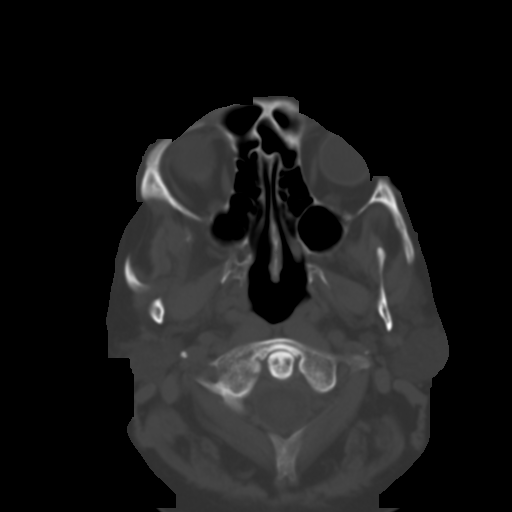
[im 6/34  brain]
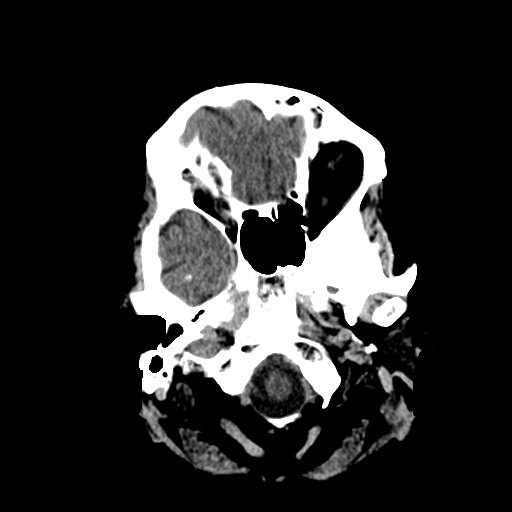
[im 10/34  brain]
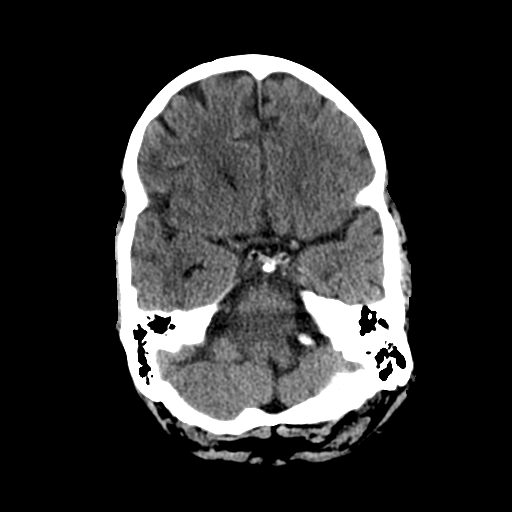
[im 12/34  brain]
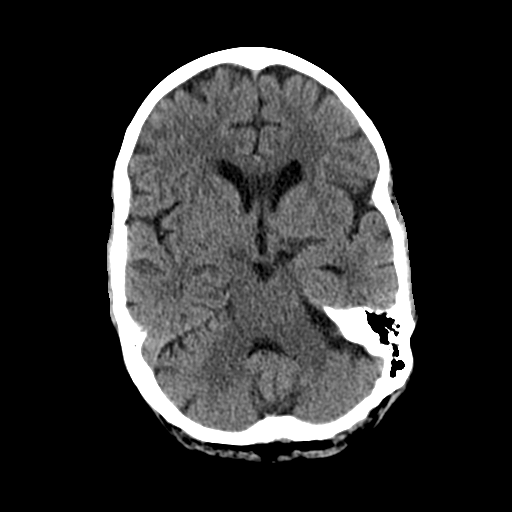
[im 15/34  brain]
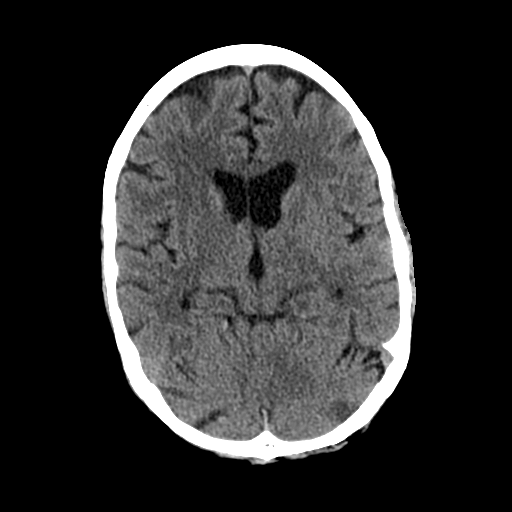
[im 15/34  bone]
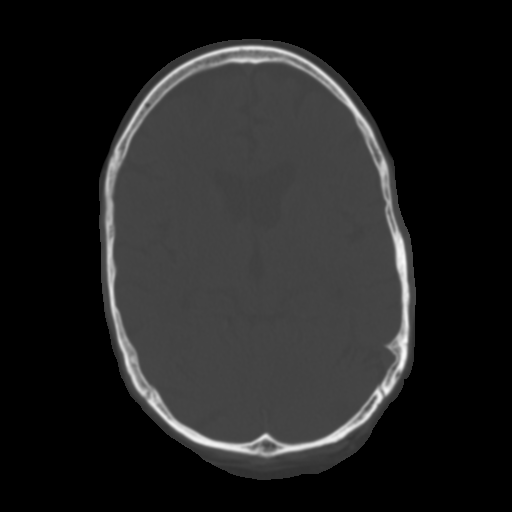
[im 19/34  brain]
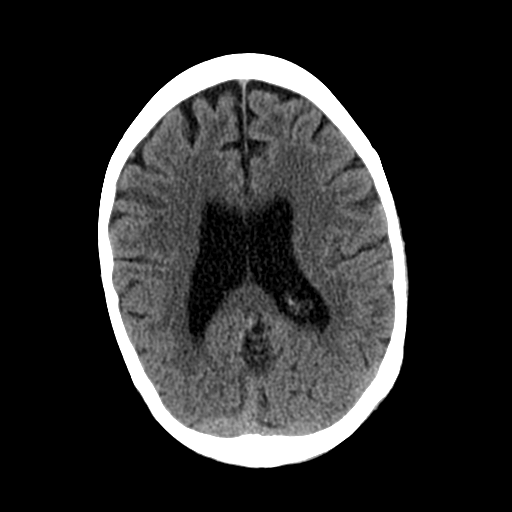
[im 22/34  brain]
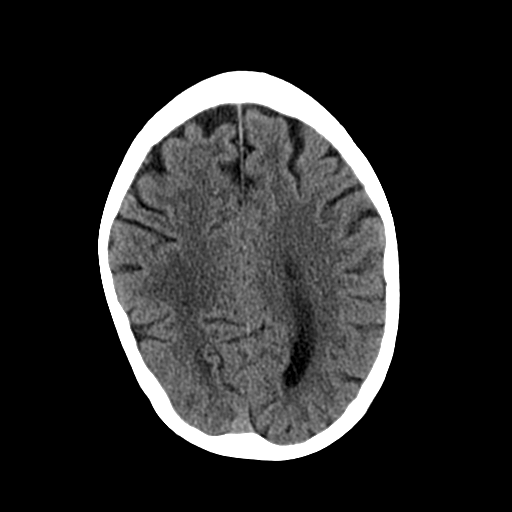
[im 26/34  brain]
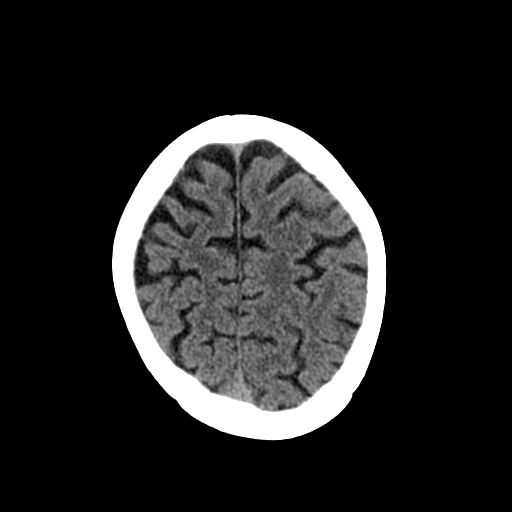
[im 28/34  brain]
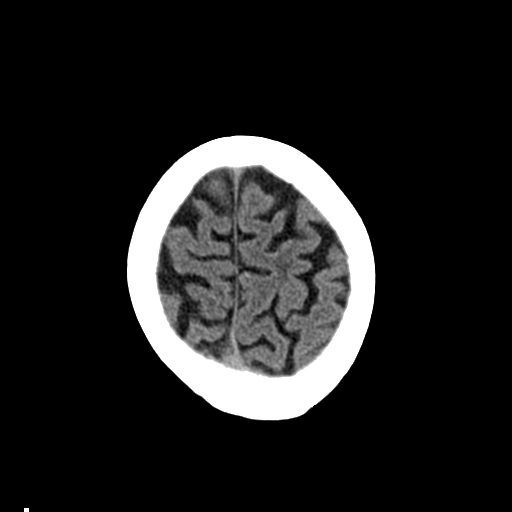
[im 28/34  bone]
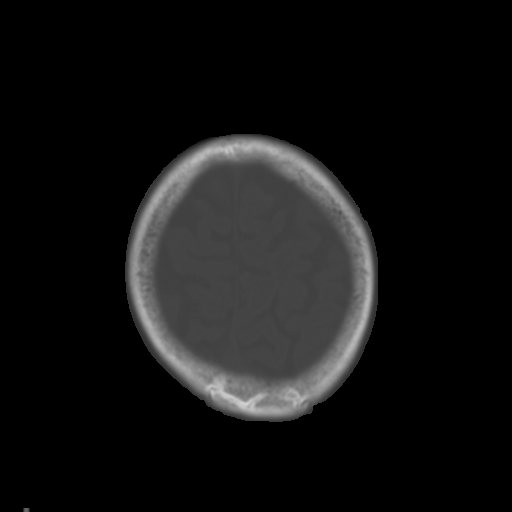
[im 31/34  brain]
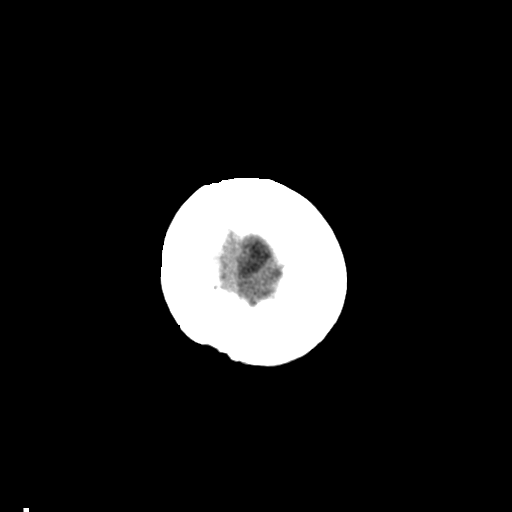

[Series 5: coronal soft tissue · coronal · 0.33mm/px · 3 of 71 slices shown]
[im 24/71  brain]
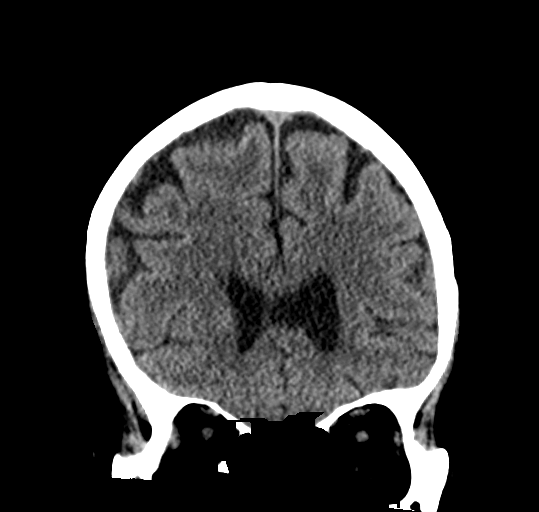
[im 32/71  brain]
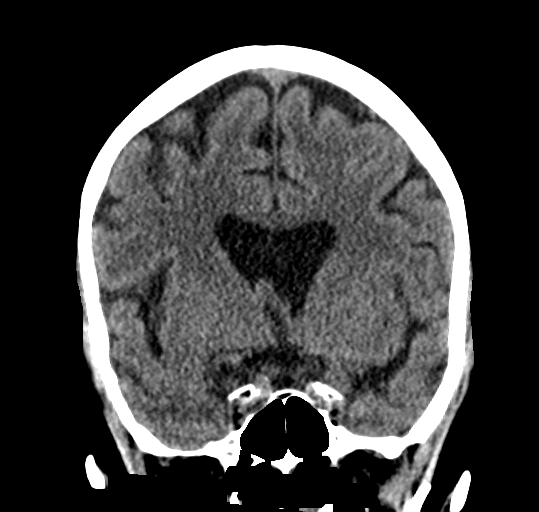
[im 39/71  brain]
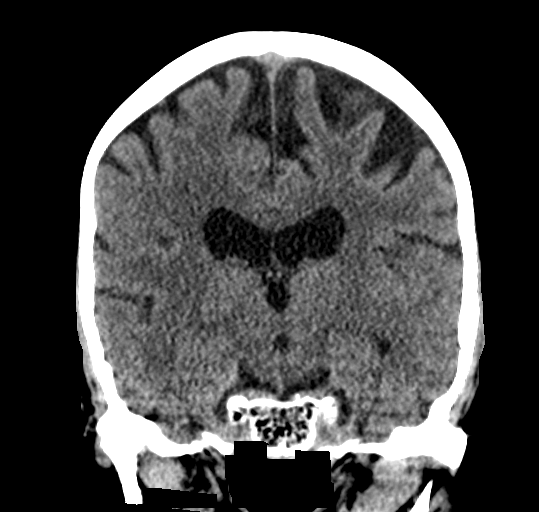

[Series 6: sagittal soft tissue · sagittal · 0.33mm/px · 3 of 54 slices shown]
[im 18/54  brain]
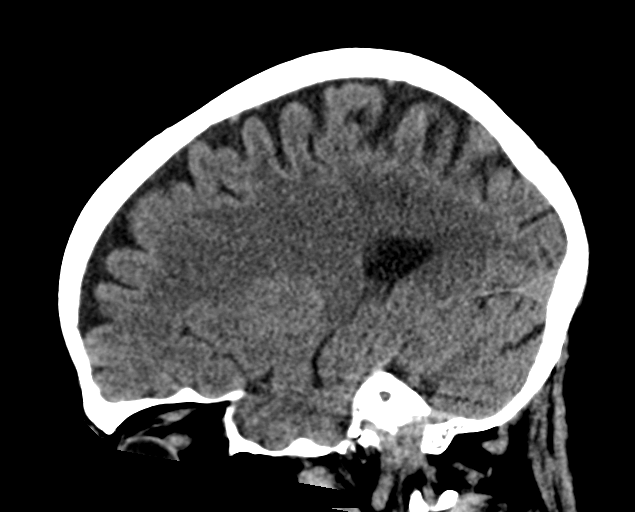
[im 27/54  brain]
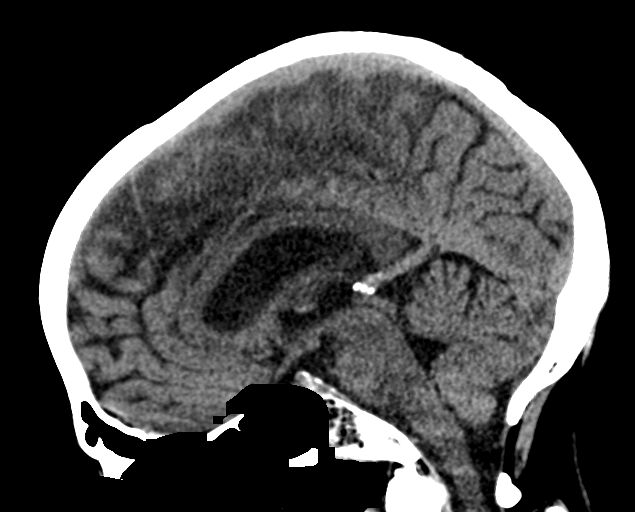
[im 36/54  brain]
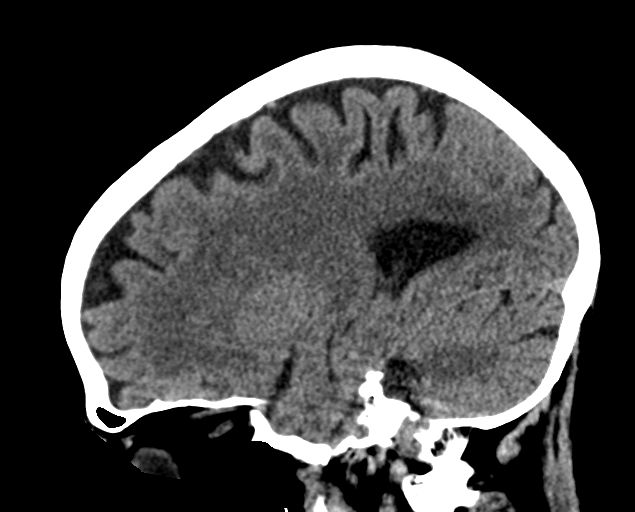

[16 of 47 positions shown; findings below may reference images not displayed]

FINDINGS: Brain: No evidence of malformation, atrophy, old or acute small or
large vessel infarction, mass lesion, hemorrhage, hydrocephalus or
extra-axial collection. No evidence of pituitary lesion.

Vascular: No vascular calcification.  No hyperdense vessels.

Skull: Normal.  No fracture or focal bone lesion.

Sinuses/Orbits: Visualized sinuses are clear. No fluid in the middle
ears or mastoids. Visualized orbits are normal.

Other: None significant
IMPRESSION: Normal examination. No abnormality seen to explain the presenting
symptoms.

## 2019-09-13 IMAGING — CR DG CHEST 2V
2 series · 2 of 2 positions shown · non-contrast
Comparison: 07/13/2017

CLINICAL DATA: Worsening cough, congestion and dyspnea

EXAM:
CHEST  2 VIEW

[x chest ap]
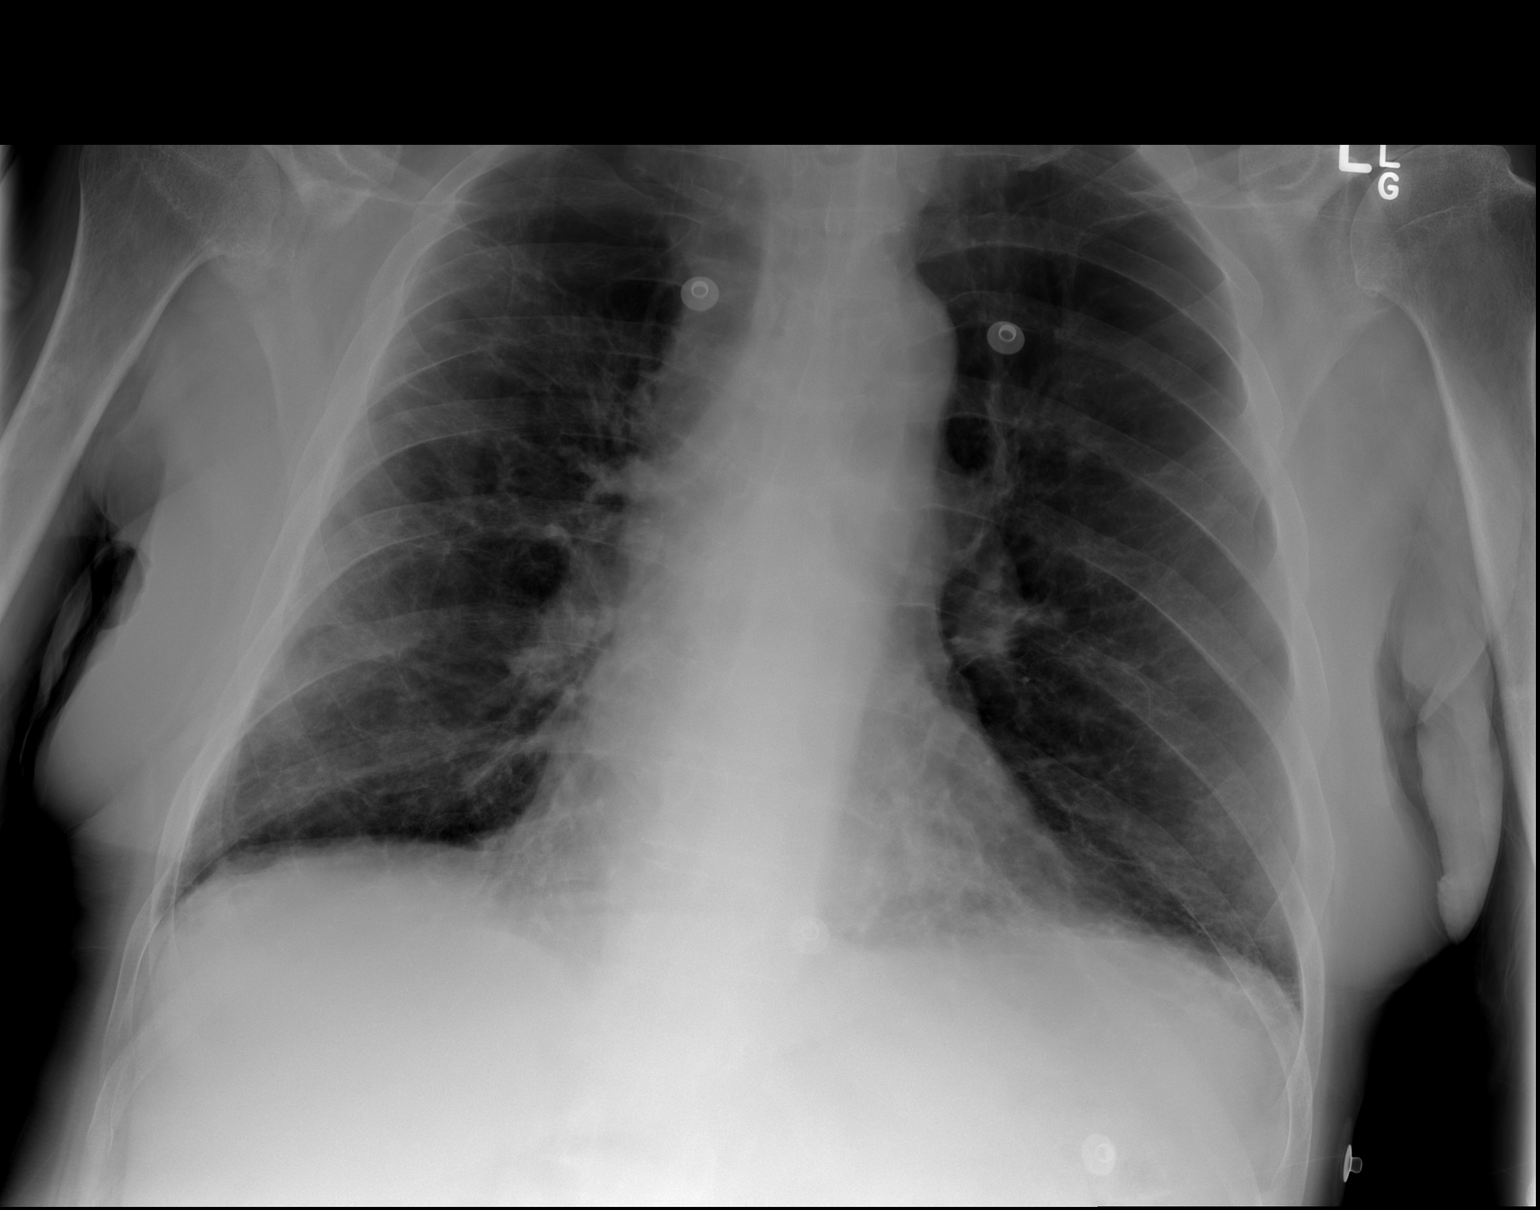

[w chest lat]
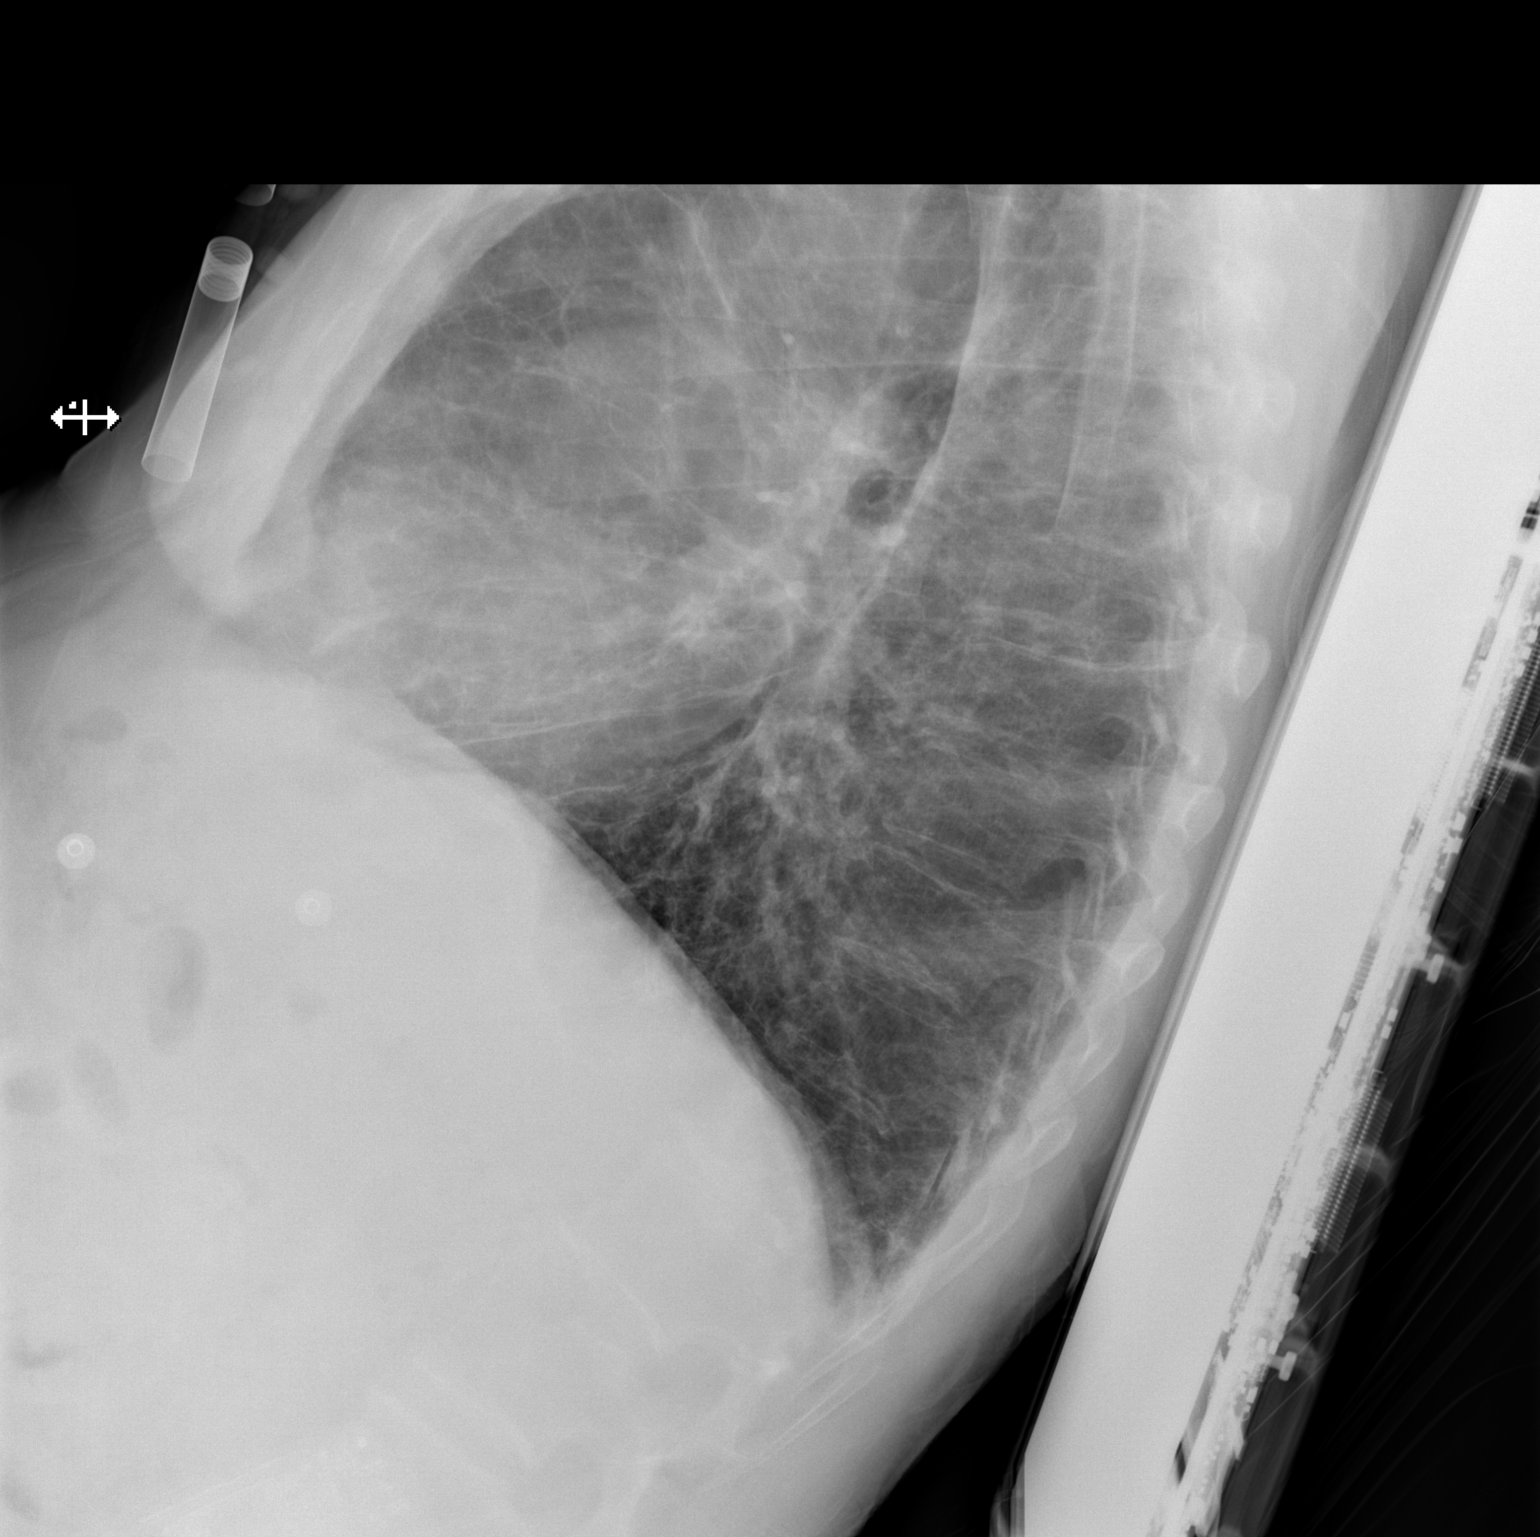

[2 of 2 positions shown; findings below may reference images not displayed]

FINDINGS: The heart size and mediastinal contours are within normal limits. No
pneumonic consolidations or CHF. Chronic subpleural areas of
interstitial prominence likely reflecting atelectasis and/or
fibrosis are identified along the periphery of the right lung and
left lung base. The visualized skeletal structures are unremarkable.
IMPRESSION: No active cardiopulmonary disease.
